# Patient Record
Sex: Female | Born: 1945 | Race: White | Hispanic: No | Marital: Married | Smoking: Never smoker
Health system: Southern US, Community
[De-identification: ages and names within clinical notes are randomized; demographics above are authoritative.]

## PROBLEM LIST (undated history)

## (undated) DIAGNOSIS — J3089 Other allergic rhinitis: Secondary | ICD-10-CM

## (undated) DIAGNOSIS — J45909 Unspecified asthma, uncomplicated: Secondary | ICD-10-CM

## (undated) DIAGNOSIS — Z85828 Personal history of other malignant neoplasm of skin: Secondary | ICD-10-CM

## (undated) DIAGNOSIS — Z780 Asymptomatic menopausal state: Secondary | ICD-10-CM

## (undated) DIAGNOSIS — K219 Gastro-esophageal reflux disease without esophagitis: Secondary | ICD-10-CM

## (undated) DIAGNOSIS — M810 Age-related osteoporosis without current pathological fracture: Secondary | ICD-10-CM

## (undated) DIAGNOSIS — N819 Female genital prolapse, unspecified: Secondary | ICD-10-CM

## (undated) DIAGNOSIS — T7840XA Allergy, unspecified, initial encounter: Secondary | ICD-10-CM

## (undated) DIAGNOSIS — R195 Other fecal abnormalities: Principal | ICD-10-CM

## (undated) DIAGNOSIS — Z8589 Personal history of malignant neoplasm of other organs and systems: Secondary | ICD-10-CM

## (undated) DIAGNOSIS — M8580 Other specified disorders of bone density and structure, unspecified site: Secondary | ICD-10-CM

## (undated) HISTORY — PX: BREAST LUMPECTOMY: SHX2

## (undated) HISTORY — PX: REFRACTIVE SURGERY: SHX103

## (undated) HISTORY — DX: Other specified disorders of bone density and structure, unspecified site: M85.80

## (undated) HISTORY — PX: ELBOW SURGERY: SHX618

## (undated) HISTORY — PX: ABDOMINAL HYSTERECTOMY: SHX81

## (undated) HISTORY — PX: TONSILLECTOMY: SUR1361

## (undated) HISTORY — DX: Personal history of other malignant neoplasm of skin: Z85.828

## (undated) HISTORY — DX: Female genital prolapse, unspecified: N81.9

## (undated) HISTORY — DX: Age-related osteoporosis without current pathological fracture: M81.0

## (undated) HISTORY — DX: Other fecal abnormalities: R19.5

## (undated) HISTORY — DX: Personal history of malignant neoplasm of other organs and systems: Z85.89

## (undated) HISTORY — DX: Allergy, unspecified, initial encounter: T78.40XA

## (undated) HISTORY — PX: CERVICAL CONE BIOPSY: SUR198

## (undated) HISTORY — DX: Asymptomatic menopausal state: Z78.0

---

## 1999-06-03 HISTORY — PX: COLONOSCOPY: SHX174

## 1999-06-03 HISTORY — PX: SIGMOIDOSCOPY: SUR1295

## 2001-09-20 ENCOUNTER — Ambulatory Visit (HOSPITAL_COMMUNITY): Admission: RE | Admit: 2001-09-20 | Discharge: 2001-09-20 | Payer: Self-pay | Admitting: *Deleted

## 2005-04-23 ENCOUNTER — Other Ambulatory Visit: Admission: RE | Admit: 2005-04-23 | Discharge: 2005-04-23 | Payer: Self-pay | Admitting: Family Medicine

## 2010-05-30 ENCOUNTER — Encounter
Admission: RE | Admit: 2010-05-30 | Discharge: 2010-05-30 | Payer: Self-pay | Source: Home / Self Care | Attending: Gastroenterology | Admitting: Gastroenterology

## 2011-06-25 ENCOUNTER — Other Ambulatory Visit: Payer: Self-pay | Admitting: Family Medicine

## 2011-06-25 DIAGNOSIS — R1900 Intra-abdominal and pelvic swelling, mass and lump, unspecified site: Secondary | ICD-10-CM

## 2011-07-01 ENCOUNTER — Ambulatory Visit
Admission: RE | Admit: 2011-07-01 | Discharge: 2011-07-01 | Disposition: A | Discharge status: Home / Self Care | Payer: Medicare Other | Source: Ambulatory Visit | Attending: Family Medicine | Admitting: Family Medicine

## 2011-07-01 DIAGNOSIS — R1900 Intra-abdominal and pelvic swelling, mass and lump, unspecified site: Secondary | ICD-10-CM

## 2011-07-09 ENCOUNTER — Other Ambulatory Visit: Payer: Self-pay

## 2013-09-21 ENCOUNTER — Other Ambulatory Visit: Payer: Self-pay | Admitting: Family Medicine

## 2013-09-21 DIAGNOSIS — R0989 Other specified symptoms and signs involving the circulatory and respiratory systems: Secondary | ICD-10-CM

## 2013-09-22 ENCOUNTER — Ambulatory Visit
Admission: RE | Admit: 2013-09-22 | Discharge: 2013-09-22 | Disposition: A | Discharge status: Home / Self Care | Payer: Medicare Other | Source: Ambulatory Visit | Attending: Family Medicine | Admitting: Family Medicine

## 2013-09-22 DIAGNOSIS — R0989 Other specified symptoms and signs involving the circulatory and respiratory systems: Secondary | ICD-10-CM

## 2013-09-22 HISTORY — DX: Unspecified asthma, uncomplicated: J45.909

## 2013-09-22 HISTORY — DX: Other allergic rhinitis: J30.89

## 2013-09-22 HISTORY — DX: Gastro-esophageal reflux disease without esophagitis: K21.9

## 2013-10-31 ENCOUNTER — Encounter: Payer: Self-pay | Admitting: Podiatry

## 2013-10-31 ENCOUNTER — Ambulatory Visit (INDEPENDENT_AMBULATORY_CARE_PROVIDER_SITE_OTHER): Payer: Medicare Other | Admitting: Podiatry

## 2013-10-31 VITALS — BP 135/77 | HR 73 | Resp 18

## 2013-10-31 DIAGNOSIS — Q828 Other specified congenital malformations of skin: Secondary | ICD-10-CM

## 2013-10-31 NOTE — Progress Notes (Signed)
   Subjective:    Patient ID: Marie Grimes, female    DOB: 1945/06/27, 68 y.o.   MRN: [id number]  HPI I have some places on both my feet that are hurting and on the right foot it is the ball and the left foot it is the ball and the 2nd toe and 5th toe on left The last visit for similar service was 12/06/2012   Review of Systems     Objective:   Physical Exam Orientated x63 space 68 year old white female  Vascular: DP and PT pulses 2/4 bilaterally  Dermatological: Nucleated keratoses sub-third fourth MPJ right and second MPJ left Diffuse plantar keratoses second left toe noted Callus lateral nail groove fifth left toe  Musculoskeletal: Taylor's bunion deformity bilaterally Adductovarus fifth digits bilaterally       Assessment & Plan:   Assessment: Porokeratoses x2 Keratoses x1  Plan: Keratoses are debrided and packed with salinocaine  Reappoint at patient's request

## 2014-12-19 ENCOUNTER — Ambulatory Visit (INDEPENDENT_AMBULATORY_CARE_PROVIDER_SITE_OTHER): Payer: Medicare Other

## 2014-12-19 ENCOUNTER — Ambulatory Visit (INDEPENDENT_AMBULATORY_CARE_PROVIDER_SITE_OTHER): Payer: Medicare Other | Admitting: Podiatry

## 2014-12-19 ENCOUNTER — Encounter: Payer: Self-pay | Admitting: Podiatry

## 2014-12-19 VITALS — BP 117/68 | HR 67 | Resp 15

## 2014-12-19 DIAGNOSIS — M79671 Pain in right foot: Secondary | ICD-10-CM | POA: Diagnosis not present

## 2014-12-19 DIAGNOSIS — M6588 Other synovitis and tenosynovitis, other site: Secondary | ICD-10-CM | POA: Diagnosis not present

## 2014-12-19 DIAGNOSIS — M7750 Other enthesopathy of unspecified foot: Secondary | ICD-10-CM

## 2014-12-19 NOTE — Progress Notes (Signed)
Subjective:     Patient ID: Marie Grimes, female   DOB: 09-04-1945, 69 y.o.   MRN: [id number]  HPIThis patient presents to the office saying she sprained her right foot yesterday.  She says she thought her foot had snapped. She now says there is swelling below her right ankle bone.  She is concerned she may have injured her foot and desires to be evaluated and treated.   Review of Systems     Objective:   Physical Exam GENERAL APPEARANCE: Alert, conversant. Appropriately groomed. No acute distress.  VASCULAR: Pedal pulses palpable at 2/4 DP and PT bilateral.  Capillary refill time is immediate to all digits,  Proximal to distal cooling it warm to warm.  Digital hair growth is present bilateral  NEUROLOGIC: sensation is intact epicritically and protectively to 5.07 monofilament at 5/5 sites bilateral.  Light touch is intact bilateral, vibratory sensation intact bilateral, achilles tendon reflex is intact bilateral.  MUSCULOSKELETAL: acceptable muscle strength, tone and stability bilateral.  Intrinsic muscluature intact bilateral.  Rectus appearance of foot and digits noted bilateral. There is significant swelling along the course of the peroneal tendon right foot.  Weakness noted to peroneal tendon right foot.  DERMATOLOGIC: skin color, texture, and turgor are within normal limits.  No preulcerative lesions are seen, no interdigital maceration noted.  No open lesions present.  Digital nails are asymptomatic. No drainage noted. .     Assessment:     Peroneal tendonitis right foot.     Plan:     IE  Xray anklet  Mobic prescribed.

## 2014-12-20 ENCOUNTER — Telehealth: Payer: Self-pay | Admitting: *Deleted

## 2014-12-20 MED ORDER — MELOXICAM 15 MG PO TABS: 15.0000 mg | ORAL_TABLET | Freq: Every day | ORAL | Status: DC

## 2014-12-20 NOTE — Telephone Encounter (Signed)
Pt states the antiinflammatory that was to be ordered by Dr. Prudence Davidson was not called into [facility] in [place].  Dr. Prudence Davidson ordered Mobic 15mg  #30 1 tablet daily +1 refill.  Order escribed to [facility].

## 2015-01-10 ENCOUNTER — Other Ambulatory Visit: Payer: Self-pay | Admitting: Family Medicine

## 2015-01-10 DIAGNOSIS — R0989 Other specified symptoms and signs involving the circulatory and respiratory systems: Secondary | ICD-10-CM

## 2015-01-17 ENCOUNTER — Ambulatory Visit
Admission: RE | Admit: 2015-01-17 | Discharge: 2015-01-17 | Disposition: A | Discharge status: Home / Self Care | Payer: Medicare Other | Source: Ambulatory Visit | Attending: Family Medicine | Admitting: Family Medicine

## 2015-01-17 DIAGNOSIS — R0989 Other specified symptoms and signs involving the circulatory and respiratory systems: Secondary | ICD-10-CM

## 2015-02-21 ENCOUNTER — Ambulatory Visit: Payer: Medicare Other | Admitting: Podiatry

## 2016-06-02 HISTORY — PX: FRACTURE SURGERY: SHX138

## 2018-01-21 LAB — LIPID PANEL
Cholesterol: 179 (ref 0–200)
HDL: 61 (ref 35–70)
LDL CALC: 108
Triglycerides: 51 (ref 40–160)

## 2018-01-22 LAB — BASIC METABOLIC PANEL
BUN: 14 (ref 4–21)
Creatinine: 0.8 (ref 0.5–1.1)
GLUCOSE: 91
Potassium: 4.4 (ref 3.4–5.3)
Sodium: 140 (ref 137–147)

## 2018-01-22 LAB — HEPATIC FUNCTION PANEL
ALT: 23 (ref 7–35)
AST: 24 (ref 13–35)

## 2018-04-16 ENCOUNTER — Encounter: Payer: Self-pay | Admitting: Osteopathic Medicine

## 2018-04-16 ENCOUNTER — Ambulatory Visit (INDEPENDENT_AMBULATORY_CARE_PROVIDER_SITE_OTHER): Payer: Medicare Other | Admitting: Physician Assistant

## 2018-04-16 ENCOUNTER — Encounter: Payer: Self-pay | Admitting: Physician Assistant

## 2018-04-16 VITALS — BP 144/80 | HR 72 | Wt 179.0 lb

## 2018-04-16 DIAGNOSIS — J4530 Mild persistent asthma, uncomplicated: Secondary | ICD-10-CM | POA: Diagnosis not present

## 2018-04-16 DIAGNOSIS — M8580 Other specified disorders of bone density and structure, unspecified site: Secondary | ICD-10-CM

## 2018-04-16 DIAGNOSIS — N819 Female genital prolapse, unspecified: Secondary | ICD-10-CM | POA: Diagnosis not present

## 2018-04-16 DIAGNOSIS — Z7689 Persons encountering health services in other specified circumstances: Secondary | ICD-10-CM

## 2018-04-16 DIAGNOSIS — Z90710 Acquired absence of both cervix and uterus: Secondary | ICD-10-CM | POA: Diagnosis not present

## 2018-04-16 DIAGNOSIS — Z9109 Other allergy status, other than to drugs and biological substances: Secondary | ICD-10-CM

## 2018-04-16 NOTE — Progress Notes (Signed)
New Patient Office Visit  Subjective:  Patient ID: Marie Grimes, female    DOB: [birth year]  Age: 72 y.o. MRN: [id number]  CC:  Chief Complaint  Patient presents with  . Establish Care    HPI Marie Grimes presents for   She actually intended to establish care with Dr. Sheppard Coil today. She was encouraged to follow-up with either one of [place] (her preference).  She has asthma and environmental allergies. Her symptoms are well-controlled with Advair, Xyzal and Flonase. She reports she does tend to get bronchitis every year and she has a history of pneumonia 8 years ago. She has had mild allergic reactions to both pneumococcal vaccines.  Osteopenia: on Boniva for approx 7 years, she did take a medication holiday for 2-3 years. She has a bone density scheduled for January. She has a history of 2 fractures, most recent was 2018 left distal radius Denies falls in the last year.  Patient Care Team: [name], DO as PCP - General (Osteopathic Medicine) Servando Salina, MD as Consulting Physician (Obstetrics and Gynecology) Rolm Bookbinder, MD as Consulting Physician (Dermatology)   Depression screen [facility] 2/9 04/16/2018  Decreased Interest 0  Down, Depressed, Hopeless 0  PHQ - 2 Score 0   Fall Risk  04/16/2018  Falls in the past year? 0      Past Medical History:  Diagnosis Date  . Acid reflux   . Allergy   . Asthma   . Environmental and seasonal allergies   . History of basal cell carcinoma   . History of squamous cell carcinoma   . Osteopenia after menopause   . Pelvic prolapse     Past Surgical History:  Procedure Laterality Date  . ABDOMINAL HYSTERECTOMY     age 17, endometriosis  . BREAST LUMPECTOMY Left    age 20, benign  . CERVICAL CONE BIOPSY    . ELBOW SURGERY    . FRACTURE SURGERY Left 2018   wrist  . REFRACTIVE SURGERY    . TONSILLECTOMY      Family History  Problem Relation Age of Onset  . Hypertension Mother   . Stroke Mother   .  Stroke Father   . Diabetes Brother   . Celiac disease Brother     Social History   Socioeconomic History  . Marital status: Married    Spouse name: Not on file  . Number of children: Not on file  . Years of education: Not on file  . Highest education level: Not on file  Occupational History  . Not on file  Social Needs  . Financial resource strain: Not on file  . Food insecurity:    Worry: Not on file    Inability: Not on file  . Transportation needs:    Medical: Not on file    Non-medical: Not on file  Tobacco Use  . Smoking status: Never Smoker  . Smokeless tobacco: Never Used  Substance and Sexual Activity  . Alcohol use: Yes    Alcohol/week: 5.0 - 7.0 standard drinks    Types: 5 - 7 Standard drinks or equivalent per week  . Drug use: Never  . Sexual activity: Yes  Lifestyle  . Physical activity:    Days per week: Not on file    Minutes per session: Not on file  . Stress: Not on file  Relationships  . Social connections:    Talks on phone: Not on file    Gets together: Not on file    Attends  religious service: Not on file    Active member of club or organization: Not on file    Attends meetings of clubs or organizations: Not on file    Relationship status: Not on file  . Intimate partner violence:    Fear of current or ex partner: Not on file    Emotionally abused: Not on file    Physically abused: Not on file    Forced sexual activity: Not on file  Other Topics Concern  . Not on file  Social History Narrative  . Not on file    ROS Review of Systems  Respiratory: Positive for wheezing (asthma).   Genitourinary:       + prolapse  Allergic/Immunologic: Positive for environmental allergies.  All other systems reviewed and are negative.   Objective:   Today's Vitals: BP (!) 144/80   Pulse 72   Wt 179 lb (81.2 kg)   Physical Exam Gen:  alert, not ill-appearing, no distress, appropriate for age 72: head normocephalic without obvious  abnormality, conjunctiva and cornea clear, trachea midline Pulm: Normal work of breathing, normal phonation Neuro: alert and oriented x 3, no tremor MSK: extremities atraumatic, normal gait and station Skin: intact, no rashes on exposed skin, no jaundice, no cyanosis Psych: well-groomed, cooperative, good eye contact, euthymic mood, affect mood-congruent, speech is articulate, and thought processes clear and goal-directed  Assessment & Plan:    [name] was seen today for establish care.  Diagnoses and all orders for this visit:  Encounter to establish care  History of hysterectomy for benign disease  Female genital prolapse, unspecified type  Mild persistent asthma without complication  Osteopenia, unspecified location  Environmental allergies   - Personally reviewed PMH, PSH, PFH, medications, allergies, HM - Age-appropriate cancer screening: mammogram UTD per patient, due 06/2018; annual FOBT due 2020 - Other HM: DEXA scheduled 06/2018 - Influenza UTD - Tdap UTD - PHQ2 negative - Negative fall screen - Personally reviewed labs from 12/2017, normal CMP and lipids   Follow-up: Return in about 3 months (around 07/17/2018) for w/Dr A for med mgmt.   [name], [place]

## 2018-05-27 ENCOUNTER — Telehealth: Payer: Self-pay | Admitting: Physician Assistant

## 2018-05-27 NOTE — Telephone Encounter (Signed)
Patient calling in wanting to know if Dr. Sheppard Coil could  be her Primary Care Physician. Please advise.

## 2018-05-27 NOTE — Telephone Encounter (Signed)
Patient was already told by me at her visit that she could see Dr. Sheppard Coil. There was a scheduling error and she was put in an establish care slot with me instead of Dr. Loni Muse

## 2018-05-27 NOTE — Telephone Encounter (Signed)
PCP has been changed for patient and patient has been made aware. No further questions at this time.

## 2018-06-01 ENCOUNTER — Ambulatory Visit (INDEPENDENT_AMBULATORY_CARE_PROVIDER_SITE_OTHER): Payer: Medicare Other

## 2018-06-01 ENCOUNTER — Encounter: Payer: Self-pay | Admitting: Family Medicine

## 2018-06-01 ENCOUNTER — Ambulatory Visit (INDEPENDENT_AMBULATORY_CARE_PROVIDER_SITE_OTHER): Payer: Medicare Other | Admitting: Family Medicine

## 2018-06-01 VITALS — BP 158/80 | HR 69 | Wt 183.0 lb

## 2018-06-01 DIAGNOSIS — M25561 Pain in right knee: Secondary | ICD-10-CM

## 2018-06-01 DIAGNOSIS — M1711 Unilateral primary osteoarthritis, right knee: Secondary | ICD-10-CM

## 2018-06-01 DIAGNOSIS — M5416 Radiculopathy, lumbar region: Secondary | ICD-10-CM

## 2018-06-01 MED ORDER — GABAPENTIN 300 MG PO CAPS: ORAL_CAPSULE | ORAL | 3 refills | Status: DC

## 2018-06-01 MED ORDER — PREDNISONE 50 MG PO TABS: 50.0000 mg | ORAL_TABLET | Freq: Every day | ORAL | 0 refills | Status: DC

## 2018-06-01 NOTE — Patient Instructions (Signed)
Thank you for coming in today. Attend PT.  Take gabapentin up to 3x daily for nerve pain.  Take the short course of prednisone.  Recheck in 4 weeks or sooner if needed.    Sciatica  Sciatica is pain, numbness, weakness, or tingling along the path of the sciatic nerve. The sciatic nerve starts in the lower back and runs down the back of each leg. The nerve controls the muscles in the lower leg and in the back of the knee. It also provides feeling (sensation) to the back of the thigh, the lower leg, and the sole of the foot. Sciatica is a symptom of another medical condition that pinches or puts pressure on the sciatic nerve. Generally, sciatica only affects one side of the body. Sciatica usually goes away on its own or with treatment. In some cases, sciatica may keep coming back (recur). What are the causes? This condition is caused by pressure on the sciatic nerve, or pinching of the sciatic nerve. This may be the result of:  A disk in between the bones of the spine (vertebrae) bulging out too far (herniated disk).  Age-related changes in the spinal disks (degenerative disk disease).  A pain disorder that affects a muscle in the buttock (piriformis syndrome).  Extra bone growth (bone spur) near the sciatic nerve.  An injury or break (fracture) of the pelvis.  Pregnancy.  Tumor (rare). What increases the risk? The following factors may make you more likely to develop this condition:  Playing sports that place pressure or stress on the spine, such as football or weight lifting.  Having poor strength and flexibility.  A history of back injury.  A history of back surgery.  Sitting for long periods of time.  Doing activities that involve repetitive bending or lifting.  Obesity. What are the signs or symptoms? Symptoms can vary from mild to very severe, and they may include:  Any of these problems in the lower back, leg, hip, or buttock: ? Mild tingling or dull  aches. ? Burning sensations. ? Sharp pains.  Numbness in the back of the calf or the sole of the foot.  Leg weakness.  Severe back pain that makes movement difficult. These symptoms may get worse when you cough, sneeze, or laugh, or when you sit or stand for long periods of time. Being overweight may also make symptoms worse. In some cases, symptoms may recur over time. How is this diagnosed? This condition may be diagnosed based on:  Your symptoms.  A physical exam. Your health care provider may ask you to do certain movements to check whether those movements trigger your symptoms.  You may have tests, including: ? Blood tests. ? X-rays. ? MRI. ? CT scan. How is this treated? In many cases, this condition improves on its own, without any treatment. However, treatment may include:  Reducing or modifying physical activity during periods of pain.  Exercising and stretching to strengthen your abdomen and improve the flexibility of your spine.  Icing and applying heat to the affected area.  Medicines that help: ? To relieve pain and swelling. ? To relax your muscles.  Injections of medicines that help to relieve pain, irritation, and inflammation around the sciatic nerve (steroids).  Surgery. Follow these instructions at home: Medicines  Take over-the-counter and prescription medicines only as told by your health care provider.  Do not drive or operate heavy machinery while taking prescription pain medicine. Managing pain  If directed, apply ice to the affected area. ?  Put ice in a plastic bag. ? Place a towel between your skin and the bag. ? Leave the ice on for 20 minutes, 2-3 times a day.  After icing, apply heat to the affected area before you exercise or as often as told by your health care provider. Use the heat source that your health care provider recommends, such as a moist heat pack or a heating pad. ? Place a towel between your skin and the heat  source. ? Leave the heat on for 20-30 minutes. ? Remove the heat if your skin turns bright red. This is especially important if you are unable to feel pain, heat, or cold. You may have a greater risk of getting burned. Activity  Return to your normal activities as told by your health care provider. Ask your health care provider what activities are safe for you. ? Avoid activities that make your symptoms worse.  Take brief periods of rest throughout the day. Resting in a lying or standing position is usually better than sitting to rest. ? When you rest for longer periods, mix in some mild activity or stretching between periods of rest. This will help to prevent stiffness and pain. ? Avoid sitting for long periods of time without moving. Get up and move around at least one time each hour.  Exercise and stretch regularly, as told by your health care provider.  Do not lift anything that is heavier than 10 lb (4.5 kg) while you have symptoms of sciatica. When you do not have symptoms, you should still avoid heavy lifting, especially repetitive heavy lifting.  When you lift objects, always use proper lifting technique, which includes: ? Bending your knees. ? Keeping the load close to your body. ? Avoiding twisting. General instructions  Use good posture. ? Avoid leaning forward while sitting. ? Avoid hunching over while standing.  Maintain a healthy weight. Excess weight puts extra stress on your back and makes it difficult to maintain good posture.  Wear supportive, comfortable shoes. Avoid wearing high heels.  Avoid sleeping on a mattress that is too soft or too hard. A mattress that is firm enough to support your back when you sleep may help to reduce your pain.  Keep all follow-up visits as told by your health care provider. This is important. Contact a health care provider if:  You have pain that wakes you up when you are sleeping.  You have pain that gets worse when you lie  down.  Your pain is worse than you have experienced in the past.  Your pain lasts longer than 4 weeks.  You experience unexplained weight loss. Get help right away if:  You lose control of your bowel or bladder (incontinence).  You have: ? Weakness in your lower back, pelvis, buttocks, or legs that gets worse. ? Redness or swelling of your back. ? A burning sensation when you urinate. This information is not intended to replace advice given to you by your health care provider. Make sure you discuss any questions you have with your health care provider. Document Released: 05/13/2001 Document Revised: 10/23/2015 Document Reviewed: 01/26/2015 Elsevier Interactive Patient Education  2019 [employer].

## 2018-06-01 NOTE — Progress Notes (Signed)
Marie Grimes is a 72 y.o. female who presents to [facility] today for right leg pain.  Marie Grimes has a several week history of right lower leg and right lateral knee pain.  This occurred when she suffered a twisting injury to her knee.  She denies any popping or swelling.  She points to the proximal calf laterally as the area of maximum pain.  Pain is worse with prolonged standing and ambulation and better with rest.  She does also have some pain at night.  She denies any weakness or numbness.  She does also note pain in her back and her right low back as well.  She denies any new bowel or bladder problems.  No fevers or chills.    ROS:  As above  Exam:  BP (!) 158/80   Pulse 69   Wt 183 lb (83 kg)  General: Well Developed, well nourished, and in no acute distress.  Neuro/Psych: Alert and oriented x3, extra-ocular muscles intact, able to move all 4 extremities, sensation grossly intact. Skin: Warm and dry, no rashes noted.  Respiratory: Not using accessory muscles, speaking in full sentences, trachea midline.  Cardiovascular: Pulses palpable, no extremity edema. Abdomen: Does not appear distended. MSK:  L-spine nontender to spinal midline.  Tender palpation lumbar paraspinal musculature in the right. Lumbar motion is intact. Mildly positive right-sided slump test. Reflexes strength and sensation are equal bilateral lower extremities.  Right knee normal-appearing nontender normal motion stable ligamentous exam negative McMurray's testing.  Left knee nontender normal motion stable ligaments exam testing.  Right lower leg normal-appearing nontender no swelling or palpable cords.     Lab and Radiology Results X-ray images right knee personally independently reviewed.  Mild degenerative changes present without acute fracture.  Relatively normal knee x-ray Await formal radiology review    Assessment and Plan: 72 y.o. female with  right leg pain.  Etiology is somewhat unclear.  Patient describes a twisting injury however her knee exam is largely benign as is her x-ray.  Her pain is more consistent with the distribution of right L5 radiculopathy.  She does have some back pain to support this.  Additionally she does have a positive slump test.  Plan to proceed with a short course of prednisone, physical therapy, and gabapentin.  Recheck in about 4 weeks or sooner if needed.  If not improving neck step would be x-ray L-spine and potential MRI for epidural steroid injection planning.  Lengthy discussion with patient regarding the uncertainty of her diagnosis treatment plan and options and neck steps.    Orders Placed This Encounter  Procedures  . DG Knee Complete 4 Views Right    Please include patellar sunrise, lateral, and weightbearing bilateral AP and bilateral rosenberg views    Standing Status:   Future    Number of Occurrences:   1    Standing Expiration Date:   08/01/2019    Order Specific Question:   Reason for exam:    Answer:   Please include patellar sunrise, lateral, and weightbearing bilateral AP and bilateral rosenberg views    Comments:   Please include patellar sunrise, lateral, and weightbearing bilateral AP and bilateral rosenberg views    Order Specific Question:   Preferred imaging location?    Answer:   [facility]  . DG Knee 1-2 Views Left    Standing Status:   Future    Number of Occurrences:   1    Standing Expiration Date:  08/02/2019    Order Specific Question:   Reason for Exam (SYMPTOM  OR DIAGNOSIS REQUIRED)    Answer:   For use with right knee x-ray, bilateral AP and Rosenberg standing.    Order Specific Question:   Preferred imaging location?    Answer:   [facility]  . Ambulatory referral to Physical Therapy    Referral Priority:   Routine    Referral Type:   Physical Medicine    Referral Reason:   Specialty Services Required    Requested Specialty:   Physical  Therapy   Meds ordered this encounter  Medications  . predniSONE (DELTASONE) 50 MG tablet    Sig: Take 1 tablet (50 mg total) by mouth daily.    Dispense:  5 tablet    Refill:  0  . gabapentin (NEURONTIN) 300 MG capsule    Sig: One tab PO qHS for a week, then BID for a week, then TID. May double weekly to a max of 3,600mg /day    Dispense:  180 capsule    Refill:  3    Historical information moved to improve visibility of documentation.  Past Medical History:  Diagnosis Date  . Acid reflux   . Allergy   . Asthma   . Environmental and seasonal allergies   . History of basal cell carcinoma   . History of squamous cell carcinoma   . Osteopenia after menopause   . Pelvic prolapse    Past Surgical History:  Procedure Laterality Date  . ABDOMINAL HYSTERECTOMY     age 70, endometriosis  . BREAST LUMPECTOMY Left    age 75, benign  . CERVICAL CONE BIOPSY    . ELBOW SURGERY    . FRACTURE SURGERY Left 2018   wrist  . REFRACTIVE SURGERY    . TONSILLECTOMY     Social History   Tobacco Use  . Smoking status: Never Smoker  . Smokeless tobacco: Never Used  Substance Use Topics  . Alcohol use: Yes    Alcohol/week: 5.0 - 7.0 standard drinks    Types: 5 - 7 Standard drinks or equivalent per week   family history includes Celiac disease in her brother; Diabetes in her brother; Hypertension in her mother; Stroke in her father and mother.  Medications: Current Outpatient Medications  Medication Sig Dispense Refill  . ADVAIR DISKUS 100-50 MCG/DOSE AEPB     . albuterol (PROVENTIL HFA;VENTOLIN HFA) 108 (90 BASE) MCG/ACT inhaler Inhale into the lungs.    . Calcium Carb-Cholecalciferol (CALCIUM PLUS D3 ABSORBABLE PO) Take by mouth.    . fluticasone (FLONASE) 50 MCG/ACT nasal spray     . Glucosamine HCl (GLUCOSAMINE PO) Take by mouth.    . ibandronate (BONIVA) 150 MG tablet TAKE 1 TABLET BY MOUTH MONTHLY  3  . levocetirizine (XYZAL) 5 MG tablet     . Multiple Vitamins-Minerals (WOMENS  MULTIVITAMIN PO) Take by mouth.    . Nutritional Supplements (ESTROVEN PO) Take by mouth.    Marland Kitchen omeprazole (PRILOSEC) 40 MG capsule     . XIIDRA 5 % SOLN Place 1 drop into both eyes 2 (two) times daily.  10  . gabapentin (NEURONTIN) 300 MG capsule One tab PO qHS for a week, then BID for a week, then TID. May double weekly to a max of 3,600mg /day 180 capsule 3  . predniSONE (DELTASONE) 50 MG tablet Take 1 tablet (50 mg total) by mouth daily. 5 tablet 0   No current facility-administered medications for this visit.  Allergies  Allergen Reactions  . Pneumococcal Polysaccharide Vaccine Other (See Comments)    muscle spasms and cramping  . Pertussis Vaccines Other (See Comments)    Fever, injection site reaction      Discussed warning signs or symptoms. Please see discharge instructions. Patient expresses understanding.

## 2018-06-08 ENCOUNTER — Other Ambulatory Visit: Payer: Self-pay

## 2018-06-08 ENCOUNTER — Encounter: Payer: Self-pay | Admitting: Physical Therapy

## 2018-06-08 ENCOUNTER — Ambulatory Visit: Payer: Medicare Other | Admitting: Physical Therapy

## 2018-06-08 DIAGNOSIS — R2689 Other abnormalities of gait and mobility: Secondary | ICD-10-CM

## 2018-06-08 DIAGNOSIS — R252 Cramp and spasm: Secondary | ICD-10-CM

## 2018-06-08 DIAGNOSIS — M5441 Lumbago with sciatica, right side: Secondary | ICD-10-CM

## 2018-06-08 DIAGNOSIS — M25561 Pain in right knee: Secondary | ICD-10-CM | POA: Diagnosis not present

## 2018-06-08 LAB — HM DEXA SCAN: HM Dexa Scan: NORMAL

## 2018-06-08 LAB — HM MAMMOGRAPHY

## 2018-06-08 NOTE — Therapy (Signed)
[place], [place], [address] Phone: [phone]   Fax:  [phone]  Physical Therapy Evaluation  Patient Details  Name: Marie Grimes MRN: [id number] Date of Birth: 11-17-1945 Referring Provider (PT): Gregor Hams, MD   Encounter Date: 06/08/2018  PT End of Session - 06/08/18 1112    Visit Number  1    Number of Visits  12    Date for PT Re-Evaluation  07/20/18    PT Start Time  1034    PT Stop Time  1112    PT Time Calculation (min)  38 min    Activity Tolerance  Patient tolerated treatment well    Behavior During Therapy  [facility] for tasks assessed/performed       Past Medical History:  Diagnosis Date  . Acid reflux   . Allergy   . Asthma   . Environmental and seasonal allergies   . History of basal cell carcinoma   . History of squamous cell carcinoma   . Osteopenia after menopause   . Pelvic prolapse     Past Surgical History:  Procedure Laterality Date  . ABDOMINAL HYSTERECTOMY     age 48, endometriosis  . BREAST LUMPECTOMY Left    age 38, benign  . CERVICAL CONE BIOPSY    . ELBOW SURGERY    . FRACTURE SURGERY Left 2018   wrist  . REFRACTIVE SURGERY    . TONSILLECTOMY      There were no vitals filed for this visit.   Subjective Assessment - 06/08/18 1035    Subjective  Pt is a 73 y/o female who presents to OPPT for chronic Rt sided LBP with pain radiating into Rt knee and foot.  Pt reports pain into RLE x 1 month with unknown cause.  Pain improved with medication from MD.    Diagnostic tests  xrays Rt knee: negative    Patient Stated Goals  exercises, improve pain    Currently in Pain?  Yes    Pain Score  1     Pain Location  Heel   and foot; occasional LBP   Pain Orientation  Right    Pain Descriptors / Indicators  Sore;Aching;Shooting    Pain Type  Acute pain    Pain Radiating Towards  RLE to foot    Pain Onset  1 to 4 weeks ago    Pain Frequency  Intermittent     Aggravating Factors   unknown    Pain Relieving Factors  prednisone, gabapentin, stretching, using massage bed         [facility] PT Assessment - 06/08/18 1040      Assessment   Medical Diagnosis  M54.16 (ICD-10-CM) - Lumbar radiculopathy; M25.561 (ICD-10-CM) - Acute pain of right knee     Referring Provider (PT)  Gregor Hams, MD    Onset Date/Surgical Date  --   1 month   Hand Dominance  Right    Next MD Visit  07/02/18    Prior Therapy  unrelated to this condition      Precautions   Precautions  None      Restrictions   Weight Bearing Restrictions  No      Balance Screen   Has the patient fallen in the past 6 months  No    Has the patient had a decrease in activity level because of a fear of falling?   No    Is the patient reluctant to  leave their home because of a fear of falling?   No      Home Environment   Living Environment  Private residence   independent living   Living Arrangements  Spouse/significant other    Type of [place] Access  Level entry    [place]  One level      Prior Function   Level of Independence  Independent    Vocation  Retired    Biomedical scientist  retired higher education administration    Leisure  play with 2 dogs, travel, gardening, reading, walks dogs 3x/wk      Cognition   Overall Cognitive Status  Within Functional Limits for tasks assessed      Observation/Other Assessments   Focus on Therapeutic Outcomes (FOTO)   incorrect set up      Posture/Postural Control   Posture/Postural Control  Postural limitations    Postural Limitations  Rounded Shoulders;Forward head      ROM / Strength   AROM / PROM / Strength  AROM;Strength      AROM   AROM Assessment Site  Lumbar    Lumbar Flexion  WNL    Lumbar Extension  WNL    Lumbar - Right Side Bend  WNL    Lumbar - Left Side Bend  WNL    Lumbar - Right Rotation  WNL    Lumbar - Left Rotation  WNL      Strength   Strength Assessment Site  Hip;Knee;Ankle     Right/Left Hip  Right;Left    Right Hip Flexion  4/5    Right Hip Extension  3/5    Right Hip ABduction  3/5    Left Hip Flexion  4/5    Left Hip Extension  4/5    Left Hip ABduction  3+/5    Right/Left Knee  Right;Left    Right Knee Flexion  4/5    Right Knee Extension  5/5    Left Knee Flexion  4/5    Left Knee Extension  5/5    Right/Left Ankle  Right;Left    Right Ankle Dorsiflexion  5/5    Left Ankle Dorsiflexion  5/5      Flexibility   Soft Tissue Assessment /Muscle Length  yes    Hamstrings  WNL (cramping with testing in high hamstrings)    Piriformis  tightness Rt >Lt      Palpation   SI assessment   Rt PSIS elevated    Palpation comment  trigger points noted in Rt glute med/min and pirfiormis      Special Tests    Special Tests  Lumbar    Lumbar Tests  Straight Leg Raise      Straight Leg Raise   Findings  Negative      Ambulation/Gait   Gait Pattern  Decreased stance time - right;Decreased step length - left;Antalgic                Objective measurements completed on examination: See above findings.      [place] Adult PT Treatment/Exercise - 06/08/18 1040      Exercises   Exercises  Lumbar      Lumbar Exercises: Stretches   Passive Hamstring Stretch  Right;1 rep;30 seconds    Passive Hamstring Stretch Limitations  bent knee    Piriformis Stretch  Right;1 rep;30 seconds      Lumbar Exercises: Supine   Other Supine Lumbar Exercises  isometric Rt hip  extension 10 x 5 sec with decrease in symptoms             PT Education - 06/08/18 1112    Education Details  HEP    Person(s) Educated  Patient    Methods  Explanation;Demonstration;Handout    Comprehension  Verbalized understanding;Returned demonstration;Need further instruction          PT Long Term Goals - 06/08/18 1350      PT LONG TERM GOAL #1   Title  independent with HEP    Status  New    Target Date  07/20/18      PT LONG TERM GOAL #2   Title  improve Rt hip strength  to at least 4+/5 for improved function     Status  New    Target Date  07/20/18      PT LONG TERM GOAL #3   Title  report pain < 2/10 with activity x 1 week for improved function    Status  New    Target Date  07/20/18      PT LONG TERM GOAL #4   Title  ambulate with decreased deviations for improved functional mobility    Status  New    Target Date  07/20/18             Plan - 06/08/18 1112    Clinical Impression Statement  Pt is a 73 y/o female who presents to OPPT for RLE pain consistent with lumbar radiculopathy.  Pt with pain mostly in the Rt knee and foot/heel but has pain that initiates in Rt SIJ and laterally.  Trigger points noted in glutes and piriformis.  Pt demonstrates decreased strength, decreased flexibility and elevated Rt PSIS.  Pt with decreased pain following muscle energy technique and provided as HEP.  Pt will benefit from PT to address deficits listed.    History and Personal Factors relevant to plan of care:  osteopenia, asthma, hx skin cancer    Clinical Presentation  Evolving    Clinical Presentation due to:  Rt knee pain without injury, likely radicular    Clinical Decision Making  Moderate    Rehab Potential  Good    PT Frequency  2x / week    PT Duration  6 weeks    PT Treatment/Interventions  ADLs/Self Care Home Management;Electrical Stimulation;Cryotherapy;Iontophoresis 4mg /ml Dexamethasone;Moist Heat;Traction;Ultrasound;Gait training;Stair training;Functional mobility training;Therapeutic activities;Neuromuscular re-education;Balance training;Therapeutic exercise;Patient/family education;Manual techniques;Dry needling;Taping    PT Next Visit Plan  review HEP, reassess SIJ and treat PRN, continue with hip/core strengthening, manual/modalities/DN PRN    PT Home Exercise Plan  Access Code: [email or web address]     Consulted and Agree with Plan of Care  Patient       Patient will benefit from skilled therapeutic intervention in order to improve the following  deficits and impairments:  Abnormal gait, Increased fascial restricitons, Increased muscle spasms, Decreased strength, Impaired flexibility, Pain, Postural dysfunction  Visit Diagnosis: Acute right-sided low back pain with right-sided sciatica - Plan: PT plan of care cert/re-cert  Acute pain of right knee - Plan: PT plan of care cert/re-cert  Other abnormalities of gait and mobility - Plan: PT plan of care cert/re-cert  Cramp and spasm - Plan: PT plan of care cert/re-cert     Problem List Patient Active Problem List   Diagnosis Date Noted  . Osteopenia 04/16/2018  . History of hysterectomy for benign disease 04/16/2018  . Mild persistent asthma without complication 81/19/1478  . Environmental allergies 04/16/2018  . Pelvic  prolapse       Laureen Abrahams, PT, DPT 06/08/18 1:55 PM     [facility] [place], [place], [address] Phone: [phone]   Fax:  [phone]  Name: Marie Grimes MRN: [id number] Date of Birth: May 06, 1946

## 2018-06-08 NOTE — Patient Instructions (Signed)
Access Code: [email or web address]  URL: [email or web address]  Date: 06/08/2018  Prepared by: Faustino Congress   Exercises  90/90 SI Joint Self-Correction - 10 reps - 1 sets - 5 second hold - 2x daily - 7x weekly  Supine Piriformis Stretch - 3 reps - 1 sets - 30 sec hold - 2x daily - 7x weekly  Supine Hamstring Stretch - 3 reps - 1 sets - 30 sec hold - 2x daily - 7x weekly

## 2018-06-11 ENCOUNTER — Ambulatory Visit: Payer: Medicare Other | Admitting: Physical Therapy

## 2018-06-11 DIAGNOSIS — M5441 Lumbago with sciatica, right side: Secondary | ICD-10-CM

## 2018-06-11 DIAGNOSIS — M25561 Pain in right knee: Secondary | ICD-10-CM

## 2018-06-11 DIAGNOSIS — R2689 Other abnormalities of gait and mobility: Secondary | ICD-10-CM

## 2018-06-11 NOTE — Patient Instructions (Signed)
Access Code: [email or web address]  URL: [email or web address]  Date: 06/11/2018  Prepared by: Kerin Perna   Exercises  Hooklying Hamstring Stretch with Strap - 3 reps - 1 sets - 30 seconds hold - 2x daily - 7x weekly  Supine Piriformis Stretch with Foot on Ground - 3 reps - 1 sets - 30 seconds hold - 2x daily - 7x weekly  Prone Quadriceps Stretch with Strap - 10 reps - 3 sets - 1x daily - 7x weekly  Supine Bridge - 10 reps - 1-2 sets - 5 seconds hold - 1x daily - 7x weekly  Seated Transversus Abdominis Bracing - 10 reps - 1 sets - 5-10 hold - 1x daily - 7x weekly

## 2018-06-11 NOTE — Therapy (Signed)
[place], [place], [address] Phone: [phone]   Fax:  [phone]  Physical Therapy Treatment  Patient Details  Name: Marie Grimes MRN: [id number] Date of Birth: Mar 05, 1946 Referring Provider (PT): Gregor Hams, MD   Encounter Date: 06/11/2018  PT End of Session - 06/11/18 1022    Visit Number  2    Number of Visits  12    Date for PT Re-Evaluation  07/20/18    PT Start Time  1020    PT Stop Time  1058    PT Time Calculation (min)  38 min    Activity Tolerance  Patient tolerated treatment well    Behavior During Therapy  [facility] for tasks assessed/performed       Past Medical History:  Diagnosis Date  . Acid reflux   . Allergy   . Asthma   . Environmental and seasonal allergies   . History of basal cell carcinoma   . History of squamous cell carcinoma   . Osteopenia after menopause   . Pelvic prolapse     Past Surgical History:  Procedure Laterality Date  . ABDOMINAL HYSTERECTOMY     age 33, endometriosis  . BREAST LUMPECTOMY Left    age 43, benign  . CERVICAL CONE BIOPSY    . ELBOW SURGERY    . FRACTURE SURGERY Left 2018   wrist  . REFRACTIVE SURGERY    . TONSILLECTOMY      There were no vitals filed for this visit.  Subjective Assessment - 06/11/18 1023    Subjective  Pt reports she has a sensation of tenderness to touch in her Rt heel, knee and buttock, but that it's not sore.  she is taking Gabapentin 1x/day.  She can tell 2 of the stretches are "really working".  She is feeling better than the last visit, which she attributes to sleeping better at not.     Patient Stated Goals  exercises, improve pain    Currently in Pain?  No/denies    Pain Score  0-No pain         [facility] PT Assessment - 06/11/18 0001      Assessment   Medical Diagnosis  M54.16 (ICD-10-CM) - Lumbar radiculopathy; M25.561 (ICD-10-CM) - Acute pain of right knee     Referring Provider (PT)  Gregor Hams, MD    Onset Date/Surgical Date  --   1 month   Hand Dominance  Right    Next MD Visit  07/02/18    Prior Therapy  unrelated to this condition      Flexibility   Soft Tissue Assessment /Muscle Length  yes    Quadriceps bilat 95  deg      Palpation   SI assessment   Lt ASIS higher than Rt in supine        [facility] Adult PT Treatment/Exercise - 06/11/18 0001      Lumbar Exercises: Stretches   Passive Hamstring Stretch  Right;Left;2 reps;30 seconds   supine with strap; opp knee bent   Quad Stretch  Right;Left;2 reps;30 seconds   prone with strap   Piriformis Stretch  Right;Left;2 reps;30 seconds   opp knee bent     Lumbar Exercises: Aerobic   Nustep  L4: arms/legs x 5 min  for warm up   PTA present to discuss progress     Lumbar Exercises: Seated   Other Seated Lumbar Exercises  Pt educated on sit to/from supine  via cues and demo; pt returned demo.     Other Seated Lumbar Exercises  ab set with sit to stand x 2 reps      Lumbar Exercises: Supine   Ab Set  10 reps;5 seconds    AB Set Limitations  tactile cues and VC for proper initiation.  performed supine lap press with TA bracing x 5 reps    Bridge  10 reps;5 seconds   cues for breath                 PT Long Term Goals - 06/08/18 1350      PT LONG TERM GOAL #1   Title  independent with HEP    Status  New    Target Date  07/20/18      PT LONG TERM GOAL #2   Title  improve Rt hip strength to at least 4+/5 for improved function     Status  New    Target Date  07/20/18      PT LONG TERM GOAL #3   Title  report pain < 2/10 with activity x 1 week for improved function    Status  New    Target Date  07/20/18      PT LONG TERM GOAL #4   Title  ambulate with decreased deviations for improved functional mobility    Status  New    Target Date  07/20/18            Plan - 06/11/18 1101    Clinical Impression Statement  Some discomfort in low back reported after prone Lt quad stretch; pt encouraged  to modify stretch as to not elicit pain in LB.  Otherwise, all other exercises tolerated well without symptoms.  Pt presented with slight asymmetries of pelvis; will assess and treat next visit if symptomatic.  Pt will benefit from continued PT intervention to maximize functional mobility.     Rehab Potential  Good    PT Frequency  2x / week    PT Duration  6 weeks    PT Treatment/Interventions  ADLs/Self Care Home Management;Electrical Stimulation;Cryotherapy;Iontophoresis 4mg /ml Dexamethasone;Moist Heat;Traction;Ultrasound;Gait training;Stair training;Functional mobility training;Therapeutic activities;Neuromuscular re-education;Balance training;Therapeutic exercise;Patient/family education;Manual techniques;Dry needling;Taping    PT Next Visit Plan  review HEP, reassess SIJ and treat PRN, continue with hip/core strengthening, manual/modalities/DN PRN    PT Home Exercise Plan  Access Code: [email or web address] - updated.     Consulted and Agree with Plan of Care  Patient       Patient will benefit from skilled therapeutic intervention in order to improve the following deficits and impairments:  Abnormal gait, Increased fascial restricitons, Increased muscle spasms, Decreased strength, Impaired flexibility, Pain, Postural dysfunction  Visit Diagnosis: Acute right-sided low back pain with right-sided sciatica  Acute pain of right knee  Other abnormalities of gait and mobility     Problem List Patient Active Problem List   Diagnosis Date Noted  . Osteopenia 04/16/2018  . History of hysterectomy for benign disease 04/16/2018  . Mild persistent asthma without complication 12/87/8676  . Environmental allergies 04/16/2018  . Pelvic prolapse    Kerin Perna, PTA 06/11/18 1:17 PM  [facility] [place], [place], [address] Phone: [phone]   Fax:  [phone]  Name: Marie Grimes MRN: [id number] Date of Birth:  1945-11-19

## 2018-06-15 ENCOUNTER — Encounter: Payer: Self-pay | Admitting: Physical Therapy

## 2018-06-15 ENCOUNTER — Ambulatory Visit: Payer: Medicare Other | Admitting: Physical Therapy

## 2018-06-15 DIAGNOSIS — M25561 Pain in right knee: Secondary | ICD-10-CM

## 2018-06-15 DIAGNOSIS — M5441 Lumbago with sciatica, right side: Secondary | ICD-10-CM

## 2018-06-15 DIAGNOSIS — R252 Cramp and spasm: Secondary | ICD-10-CM

## 2018-06-15 DIAGNOSIS — R2689 Other abnormalities of gait and mobility: Secondary | ICD-10-CM

## 2018-06-15 NOTE — Therapy (Signed)
[place], [place], [address] Phone: [phone]   Fax:  [phone]  Physical Therapy Treatment  Patient Details  Name: Marie Grimes MRN: [id number] Date of Birth: 11-04-1945 Referring Provider (PT): Gregor Hams, MD   Encounter Date: 06/15/2018  PT End of Session - 06/15/18 1127    Visit Number  3    Number of Visits  12    Date for PT Re-Evaluation  07/20/18    PT Start Time  1034    PT Stop Time  1115    PT Time Calculation (min)  41 min    Activity Tolerance  Patient tolerated treatment well    Behavior During Therapy  [facility] for tasks assessed/performed       Past Medical History:  Diagnosis Date  . Acid reflux   . Allergy   . Asthma   . Environmental and seasonal allergies   . History of basal cell carcinoma   . History of squamous cell carcinoma   . Osteopenia after menopause   . Pelvic prolapse     Past Surgical History:  Procedure Laterality Date  . ABDOMINAL HYSTERECTOMY     age 8, endometriosis  . BREAST LUMPECTOMY Left    age 8, benign  . CERVICAL CONE BIOPSY    . ELBOW SURGERY    . FRACTURE SURGERY Left 2018   wrist  . REFRACTIVE SURGERY    . TONSILLECTOMY      There were no vitals filed for this visit.  Subjective Assessment - 06/15/18 1036    Subjective  feelin much better; no longer taking any medication for pain    Patient Stated Goals  exercises, improve pain    Currently in Pain?  Yes    Pain Score  --   "0.5/10"   Pain Location  Hip    Pain Orientation  Right    Pain Descriptors / Indicators  Aching    Pain Type  Acute pain    Pain Onset  1 to 4 weeks ago    Pain Frequency  Intermittent         [facility] PT Assessment - 06/15/18 1119      Assessment   Medical Diagnosis  M54.16 (ICD-10-CM) - Lumbar radiculopathy; M25.561 (ICD-10-CM) - Acute pain of right knee     Referring Provider (PT)  Gregor Hams, MD    Next MD Visit  07/02/18      Palpation   SI assessment   SI assessment level                   [facility] Adult PT Treatment/Exercise - 06/15/18 1037      Lumbar Exercises: Stretches   Passive Hamstring Stretch  Right;Left;2 reps;30 seconds   supine with strap; opp knee bent   Piriformis Stretch  Right;Left;2 reps;30 seconds   opp knee bent     Lumbar Exercises: Aerobic   Nustep  L5: arms/legs x 6 min  for warm up      Lumbar Exercises: Standing   Other Standing Lumbar Exercises  hip extension and abduction x10 reps bil with cues for abdominal engagement      Lumbar Exercises: Supine   Ab Set  10 reps;5 seconds    Clam  10 reps    Clam Limitations  alternating    Bridge  10 reps;5 seconds  PT Long Term Goals - 06/08/18 1350      PT LONG TERM GOAL #1   Title  independent with HEP    Status  New    Target Date  07/20/18      PT LONG TERM GOAL #2   Title  improve Rt hip strength to at least 4+/5 for improved function     Status  New    Target Date  07/20/18      PT LONG TERM GOAL #3   Title  report pain < 2/10 with activity x 1 week for improved function    Status  New    Target Date  07/20/18      PT LONG TERM GOAL #4   Title  ambulate with decreased deviations for improved functional mobility    Status  New    Target Date  07/20/18            Plan - 06/15/18 1129    Clinical Impression Statement  Pt tolerated session well and reported improved pain following session, which pain level was already minimal today.  Pt with centralization of symptoms at this time with occasional episodes of radicular symptoms.  Overall progressing well with PT and progressing well towards goals.  May be ready for d/c early pending progress.    Rehab Potential  Good    PT Frequency  2x / week    PT Duration  6 weeks    PT Treatment/Interventions  ADLs/Self Care Home Management;Electrical Stimulation;Cryotherapy;Iontophoresis 4mg /ml Dexamethasone;Moist  Heat;Traction;Ultrasound;Gait training;Stair training;Functional mobility training;Therapeutic activities;Neuromuscular re-education;Balance training;Therapeutic exercise;Patient/family education;Manual techniques;Dry needling;Taping    PT Next Visit Plan  reassess SIJ and treat PRN, continue with hip/core strengthening, manual/modalities/DN PRN    PT Home Exercise Plan  Access Code: [email or web address]    Consulted and Agree with Plan of Care  Patient       Patient will benefit from skilled therapeutic intervention in order to improve the following deficits and impairments:  Abnormal gait, Increased fascial restricitons, Increased muscle spasms, Decreased strength, Impaired flexibility, Pain, Postural dysfunction  Visit Diagnosis: Acute right-sided low back pain with right-sided sciatica  Acute pain of right knee  Other abnormalities of gait and mobility  Cramp and spasm     Problem List Patient Active Problem List   Diagnosis Date Noted  . Osteopenia 04/16/2018  . History of hysterectomy for benign disease 04/16/2018  . Mild persistent asthma without complication 32/44/0102  . Environmental allergies 04/16/2018  . Pelvic prolapse       Laureen Abrahams, PT, DPT 06/15/18 11:31 AM    [facility] [place], [place], [address] Phone: [phone]   Fax:  [phone]  Name: SUMEDHA MUNNERLYN MRN: [id number] Date of Birth: [birth year]

## 2018-06-18 ENCOUNTER — Ambulatory Visit: Payer: Medicare Other | Admitting: Physical Therapy

## 2018-06-18 ENCOUNTER — Encounter: Payer: Self-pay | Admitting: Physical Therapy

## 2018-06-18 DIAGNOSIS — M25561 Pain in right knee: Secondary | ICD-10-CM

## 2018-06-18 DIAGNOSIS — M5441 Lumbago with sciatica, right side: Secondary | ICD-10-CM | POA: Diagnosis not present

## 2018-06-18 DIAGNOSIS — R2689 Other abnormalities of gait and mobility: Secondary | ICD-10-CM | POA: Diagnosis not present

## 2018-06-18 DIAGNOSIS — R252 Cramp and spasm: Secondary | ICD-10-CM | POA: Diagnosis not present

## 2018-06-18 NOTE — Therapy (Signed)
[place], [place], [address] Phone: [phone]   Fax:  [phone]  Physical Therapy Treatment  Patient Details  Name: Marie Grimes MRN: [id number] Date of Birth: 11/11/1945 Referring Provider (PT): Gregor Hams, MD   Encounter Date: 06/18/2018  PT End of Session - 06/18/18 1146    Visit Number  4    Number of Visits  12    Date for PT Re-Evaluation  07/20/18    PT Start Time  1030    PT Stop Time  1110    PT Time Calculation (min)  40 min    Activity Tolerance  Patient tolerated treatment well    Behavior During Therapy  [facility] for tasks assessed/performed       Past Medical History:  Diagnosis Date  . Acid reflux   . Allergy   . Asthma   . Environmental and seasonal allergies   . History of basal cell carcinoma   . History of squamous cell carcinoma   . Osteopenia after menopause   . Pelvic prolapse     Past Surgical History:  Procedure Laterality Date  . ABDOMINAL HYSTERECTOMY     age 89, endometriosis  . BREAST LUMPECTOMY Left    age 59, benign  . CERVICAL CONE BIOPSY    . ELBOW SURGERY    . FRACTURE SURGERY Left 2018   wrist  . REFRACTIVE SURGERY    . TONSILLECTOMY      There were no vitals filed for this visit.  Subjective Assessment - 06/18/18 1033    Subjective  hamstring is cramping with bridging but otherwise doing well.  feels the piriformis is looser.    Patient Stated Goals  exercises, improve pain    Pain Score  0-No pain    Pain Onset  1 to 4 weeks ago         [facility] PT Assessment - 06/18/18 1034      Assessment   Medical Diagnosis  M54.16 (ICD-10-CM) - Lumbar radiculopathy; M25.561 (ICD-10-CM) - Acute pain of right knee     Referring Provider (PT)  Gregor Hams, MD                   [facility] Adult PT Treatment/Exercise - 06/18/18 1034      Lumbar Exercises: Stretches   Passive Hamstring Stretch  Right;Left;2 reps;30 seconds   supine  with strap; opp knee bent   Prone on Elbows Stretch  3 reps;60 seconds    Prone on Elbows Stretch Limitations  continuous    Piriformis Stretch  Right;Left;2 reps;30 seconds   opp knee bent   Other Lumbar Stretch Exercise  child's pose 2x20 sec      Lumbar Exercises: Aerobic   Nustep  L5: arms/legs x 6 min  for warm up      Lumbar Exercises: Seated   Long Arc Quad on [place]  Both;10 reps    Hip Flexion on [place]  Both;10 reps    Sit to Stand  10 reps    Sit to Stand Limitations  holding yellow medicine ball    Other Seated Lumbar Exercises  seated pelvic tilts 10 x 5 sec      Lumbar Exercises: Supine   Bridge  10 reps;5 seconds      Lumbar Exercises: Quadruped   [place] Horse  10 reps                  PT Long  Term Goals - 06/08/18 1350      PT LONG TERM GOAL #1   Title  independent with HEP    Status  New    Target Date  07/20/18      PT LONG TERM GOAL #2   Title  improve Rt hip strength to at least 4+/5 for improved function     Status  New    Target Date  07/20/18      PT LONG TERM GOAL #3   Title  report pain < 2/10 with activity x 1 week for improved function    Status  New    Target Date  07/20/18      PT LONG TERM GOAL #4   Title  ambulate with decreased deviations for improved functional mobility    Status  New    Target Date  07/20/18            Plan - 06/18/18 1147    Clinical Impression Statement  Pt reporting minimal radicular symptoms today and recommended she try prone on elbows if experiencing symptoms to assess for centralization.  Pt continues to do well with PT reporting significant improvment in function and pain overall.  Will continue to benefit from PT to address remaining deficits and decrease risk of reinjury.    Rehab Potential  Good    PT Frequency  2x / week    PT Duration  6 weeks    PT Treatment/Interventions  ADLs/Self Care Home Management;Electrical Stimulation;Cryotherapy;Iontophoresis 4mg /ml Dexamethasone;Moist  Heat;Traction;Ultrasound;Gait training;Stair training;Functional mobility training;Therapeutic activities;Neuromuscular re-education;Balance training;Therapeutic exercise;Patient/family education;Manual techniques;Dry needling;Taping    PT Next Visit Plan  reassess SIJ and treat PRN, continue with hip/core strengthening, manual/modalities/DN PRN    PT Home Exercise Plan  Access Code: [email or web address]    Consulted and Agree with Plan of Care  Patient       Patient will benefit from skilled therapeutic intervention in order to improve the following deficits and impairments:  Abnormal gait, Increased fascial restricitons, Increased muscle spasms, Decreased strength, Impaired flexibility, Pain, Postural dysfunction  Visit Diagnosis: Acute right-sided low back pain with right-sided sciatica  Acute pain of right knee  Other abnormalities of gait and mobility  Cramp and spasm     Problem List Patient Active Problem List   Diagnosis Date Noted  . Osteopenia 04/16/2018  . History of hysterectomy for benign disease 04/16/2018  . Mild persistent asthma without complication 76/73/4193  . Environmental allergies 04/16/2018  . Pelvic prolapse       Laureen Abrahams, PT, DPT 06/18/18 11:48 AM     [facility] [place], [place], [address] Phone: [phone]   Fax:  [phone]  Name: Marie Grimes MRN: [id number] Date of Birth: 07/28/1945

## 2018-06-22 ENCOUNTER — Ambulatory Visit: Payer: Medicare Other | Admitting: Physical Therapy

## 2018-06-22 ENCOUNTER — Encounter: Payer: Self-pay | Admitting: Physical Therapy

## 2018-06-22 DIAGNOSIS — R252 Cramp and spasm: Secondary | ICD-10-CM

## 2018-06-22 DIAGNOSIS — M25561 Pain in right knee: Secondary | ICD-10-CM

## 2018-06-22 DIAGNOSIS — R2689 Other abnormalities of gait and mobility: Secondary | ICD-10-CM | POA: Diagnosis not present

## 2018-06-22 DIAGNOSIS — M5441 Lumbago with sciatica, right side: Secondary | ICD-10-CM | POA: Diagnosis not present

## 2018-06-22 NOTE — Patient Instructions (Signed)

## 2018-06-22 NOTE — Therapy (Signed)
[place], [place], [address] Phone: [phone]   Fax:  [phone]  Physical Therapy Treatment  Patient Details  Name: Marie Grimes MRN: [id number] Date of Birth: 31-Dec-1945 Referring Provider (PT): Gregor Hams, MD   Encounter Date: 06/22/2018  PT End of Session - 06/22/18 1155    Visit Number  5    Number of Visits  12    Date for PT Re-Evaluation  07/20/18    PT Start Time  1030    PT Stop Time  1134    PT Time Calculation (min)  64 min    Activity Tolerance  Patient tolerated treatment well    Behavior During Therapy  [facility] for tasks assessed/performed       Past Medical History:  Diagnosis Date  . Acid reflux   . Allergy   . Asthma   . Environmental and seasonal allergies   . History of basal cell carcinoma   . History of squamous cell carcinoma   . Osteopenia after menopause   . Pelvic prolapse     Past Surgical History:  Procedure Laterality Date  . ABDOMINAL HYSTERECTOMY     age 73, endometriosis  . BREAST LUMPECTOMY Left    age 73, benign  . CERVICAL CONE BIOPSY    . ELBOW SURGERY    . FRACTURE SURGERY Left 2018   wrist  . REFRACTIVE SURGERY    . TONSILLECTOMY      There were no vitals filed for this visit.  Subjective Assessment - 06/22/18 1030    Subjective  has been working hard with exercises, and still having pain in hip and knee.  feels muscle tone is improving.    Patient Stated Goals  exercises, improve pain    Currently in Pain?  Yes    Pain Score  2     Pain Location  Buttocks    Pain Orientation  Right    Pain Descriptors / Indicators  Aching    Pain Type  Acute pain    Pain Radiating Towards  to knee/heel on Rt    Pain Onset  1 to 4 weeks ago    Pain Frequency  Intermittent    Aggravating Factors   unknown    Pain Relieving Factors  prednisone, gabapentin         [facility] PT Assessment - 06/22/18 1131      Assessment   Medical Diagnosis   M54.16 (ICD-10-CM) - Lumbar radiculopathy; M25.561 (ICD-10-CM) - Acute pain of right knee     Referring Provider (PT)  Gregor Hams, MD      Palpation   SI assessment   Rt ASIS, iliac crest and PSIS all lower than Lt    Palpation comment  trigger points into Rt ant tib and Rt QL, lumbar paraspinals      Special Tests    Special Tests  Leg LengthTest    Leg length test   True      True   Length  cm    Right  91.8 in.    Left   93.2 in.    Comments  added heel lift to Rt heel with improvement in symptoms and improved pelvis symmetries                   [facility] Adult PT Treatment/Exercise - 06/22/18 1032      Lumbar Exercises: Aerobic   Nustep  L5: arms/legs x 6 min  for warm up      Modalities   Modalities  Electrical Stimulation;Moist Heat      Moist Heat Therapy   Number Minutes Moist Heat  15 Minutes    Moist Heat Location  Lumbar Spine      Electrical Stimulation   Electrical Stimulation Location  Rt lumbar spine    Electrical Stimulation Action  IFC    Electrical Stimulation Parameters  to tolerance    Electrical Stimulation Goals  Pain      Manual Therapy   Manual Therapy  Soft tissue mobilization    Manual therapy comments  skilled palpation and monitoring of soft tissue during DN    Soft tissue mobilization  Rt ant tib and Rt QL into lumbar paraspinals       Trigger Point Dry Needling - 06/22/18 1150    Consent Given?  Yes    Education Handout Provided  Yes    Muscles Treated Upper Body  Quadratus Lumborum;Longissimus    Muscles Treated Lower Body  Tibialis anterior    Longissimus Response  Twitch response elicited;Palpable increased muscle length   lumbar paraspinals and QL on Rt   Tibialis Anterior Response  Twitch response elicited;Palpable increased muscle length           PT Education - 06/22/18 1155    Education Details  DN    Person(s) Educated  Patient    Methods  Explanation;Handout    Comprehension  Verbalized understanding           PT Long Term Goals - 06/08/18 1350      PT LONG TERM GOAL #1   Title  independent with HEP    Status  New    Target Date  07/20/18      PT LONG TERM GOAL #2   Title  improve Rt hip strength to at least 4+/5 for improved function     Status  New    Target Date  07/20/18      PT LONG TERM GOAL #3   Title  report pain < 2/10 with activity x 1 week for improved function    Status  New    Target Date  07/20/18      PT LONG TERM GOAL #4   Title  ambulate with decreased deviations for improved functional mobility    Status  New    Target Date  07/20/18            Plan - 06/22/18 1155    Clinical Impression Statement  Pt tolerated session well today and reported decreased pain and symptoms following session today.  Pt noted to have Rt ASIS, iliac crest and PSIS all lower than Lt therefore assessed leg length, noticing LLE longer than RLE.  Added heel lift with immediate improvement in symptoms, and then followed up with manual therapy and DN to address areas with active trigger points.  Pt will continue to benefit from PT to address deficits listed.    Rehab Potential  Good    PT Frequency  2x / week    PT Duration  6 weeks    PT Treatment/Interventions  ADLs/Self Care Home Management;Electrical Stimulation;Cryotherapy;Iontophoresis 4mg /ml Dexamethasone;Moist Heat;Traction;Ultrasound;Gait training;Stair training;Functional mobility training;Therapeutic activities;Neuromuscular re-education;Balance training;Therapeutic exercise;Patient/family education;Manual techniques;Dry needling;Taping    PT Next Visit Plan  reassess SIJ and treat PRN, continue with hip/core strengthening, manual/modalities/DN PRN    PT Home Exercise Plan  Access Code: [email or web address]    Consulted and Agree with Plan of Care  Patient  Patient will benefit from skilled therapeutic intervention in order to improve the following deficits and impairments:  Abnormal gait, Increased fascial restricitons,  Increased muscle spasms, Decreased strength, Impaired flexibility, Pain, Postural dysfunction  Visit Diagnosis: Acute right-sided low back pain with right-sided sciatica  Acute pain of right knee  Other abnormalities of gait and mobility  Cramp and spasm     Problem List Patient Active Problem List   Diagnosis Date Noted  . Osteopenia 04/16/2018  . History of hysterectomy for benign disease 04/16/2018  . Mild persistent asthma without complication 84/05/8207  . Environmental allergies 04/16/2018  . Pelvic prolapse       Laureen Abrahams, PT, DPT 06/22/18 11:58 AM    [facility] [place], [place], [address] Phone: [phone]   Fax:  [phone]  Name: Marie Grimes MRN: [id number] Date of Birth: Jan 10, 1946

## 2018-06-25 ENCOUNTER — Encounter: Payer: Self-pay | Admitting: Physical Therapy

## 2018-06-25 ENCOUNTER — Ambulatory Visit: Payer: Medicare Other | Admitting: Physical Therapy

## 2018-06-25 DIAGNOSIS — M25561 Pain in right knee: Secondary | ICD-10-CM | POA: Diagnosis not present

## 2018-06-25 DIAGNOSIS — R252 Cramp and spasm: Secondary | ICD-10-CM | POA: Diagnosis not present

## 2018-06-25 DIAGNOSIS — R2689 Other abnormalities of gait and mobility: Secondary | ICD-10-CM

## 2018-06-25 DIAGNOSIS — M5441 Lumbago with sciatica, right side: Secondary | ICD-10-CM | POA: Diagnosis not present

## 2018-06-25 NOTE — Therapy (Signed)
Marie Gr[place], [place], [address] Phone: [phone]   Fax:  [phone]  Physical Therapy Treatment  Patient Details  Name: Marie Grimes MRN: [id number] Date of Birth: 07-Aug-1945 Referring Provider (PT): Gregor Hams, MD   Encounter Date: 06/25/2018  PT End of Session - 06/25/18 1108    Visit Number  6    Number of Visits  12    Date for PT Re-Evaluation  07/20/18    PT Start Time  1030    PT Stop Time  1125    PT Time Calculation (min)  55 min    Activity Tolerance  Patient tolerated treatment well    Behavior During Therapy  [facility] for tasks assessed/performed       Past Medical History:  Diagnosis Date  . Acid reflux   . Allergy   . Asthma   . Environmental and seasonal allergies   . History of basal cell carcinoma   . History of squamous cell carcinoma   . Osteopenia after menopause   . Pelvic prolapse     Past Surgical History:  Procedure Laterality Date  . ABDOMINAL HYSTERECTOMY     age 73, endometriosis  . BREAST LUMPECTOMY Left    age 73, benign  . CERVICAL CONE BIOPSY    . ELBOW SURGERY    . FRACTURE SURGERY Left 2018   wrist  . REFRACTIVE SURGERY    . TONSILLECTOMY      There were no vitals filed for this visit.  Subjective Assessment - 06/25/18 1033    Subjective  had what she describes as a "deep shiver" Wednesday night.  only experiencing mild discomfort at this time.  felt DN and TENS were helpful.    Patient Stated Goals  exercises, improve pain    Pain Score  0-No pain                       [facility] Adult PT Treatment/Exercise - 06/25/18 1035      Lumbar Exercises: Stretches   Passive Hamstring Stretch  Right;Left;2 reps;30 seconds   supine with strap   Piriformis Stretch  Right;Left;2 reps;30 seconds      Lumbar Exercises: Aerobic   Nustep  L5: arms/legs x 6 min  for warm up      Lumbar Exercises: Supine   Bridge  10 reps;5 seconds    Bridge Limitations  x10 with strap for isometric hip abduction    Other Supine Lumbar Exercises  ab set with isometric hip abduction x 10 reps      Lumbar Exercises: Sidelying   Clam  Right;Left;10 reps;3 seconds      Modalities   Modalities  Electrical Stimulation;Moist Heat      Moist Heat Therapy   Number Minutes Moist Heat  15 Minutes    Moist Heat Location  Lumbar Spine      Electrical Stimulation   Electrical Stimulation Location  Rt lumbar spine    Electrical Stimulation Action  IFC    Electrical Stimulation Parameters  to tolerance    Electrical Stimulation Goals  Pain                  PT Long Term Goals - 06/08/18 1350      PT LONG TERM GOAL #1   Title  independent with HEP    Status  New    Target Date  07/20/18      PT LONG  TERM GOAL #2   Title  improve Rt hip strength to at least 4+/5 for improved function     Status  New    Target Date  07/20/18      PT LONG TERM GOAL #3   Title  report pain < 2/10 with activity x 1 week for improved function    Status  New    Target Date  07/20/18      PT LONG TERM GOAL #4   Title  ambulate with decreased deviations for improved functional mobility    Status  New    Target Date  07/20/18            Plan - 06/25/18 1108    Clinical Impression Statement  Pt reporting improved pain and symptoms following last session and continues to do well with PT.  Anticipate pt may be ready for d/c early if she continues to progress well.    Rehab Potential  Good    PT Frequency  2x / week    PT Duration  6 weeks    PT Treatment/Interventions  ADLs/Self Care Home Management;Electrical Stimulation;Cryotherapy;Iontophoresis 4mg /ml Dexamethasone;Moist Heat;Traction;Ultrasound;Gait training;Stair training;Functional mobility training;Therapeutic activities;Neuromuscular re-education;Balance training;Therapeutic exercise;Patient/family education;Manual techniques;Dry needling;Taping    PT Next Visit Plan  reassess SIJ and  treat PRN, continue with hip/core strengthening, manual/modalities/DN PRN    PT Home Exercise Plan  Access Code: [email or web address]    Consulted and Agree with Plan of Care  Patient       Patient will benefit from skilled therapeutic intervention in order to improve the following deficits and impairments:  Abnormal gait, Increased fascial restricitons, Increased muscle spasms, Decreased strength, Impaired flexibility, Pain, Postural dysfunction  Visit Diagnosis: Acute right-sided low back pain with right-sided sciatica  Acute pain of right knee  Other abnormalities of gait and mobility  Cramp and spasm     Problem List Patient Active Problem List   Diagnosis Date Noted  . Osteopenia 04/16/2018  . History of hysterectomy for benign disease 04/16/2018  . Mild persistent asthma without complication 39/76/7341  . Environmental allergies 04/16/2018  . Pelvic prolapse       Laureen Abrahams, PT, DPT 06/25/18 11:10 AM    [facility] [place], [place], [address] Phone: [phone]   Fax:  [phone]  Name: Marie Grimes MRN: [id number] Date of Birth: [birth year]

## 2018-06-29 ENCOUNTER — Encounter: Payer: Medicare Other | Admitting: Physical Therapy

## 2018-06-30 ENCOUNTER — Encounter: Payer: Self-pay | Admitting: Physical Therapy

## 2018-06-30 ENCOUNTER — Ambulatory Visit: Payer: Medicare Other | Admitting: Physical Therapy

## 2018-06-30 DIAGNOSIS — M25561 Pain in right knee: Secondary | ICD-10-CM | POA: Diagnosis not present

## 2018-06-30 DIAGNOSIS — R2689 Other abnormalities of gait and mobility: Secondary | ICD-10-CM | POA: Diagnosis not present

## 2018-06-30 DIAGNOSIS — R252 Cramp and spasm: Secondary | ICD-10-CM

## 2018-06-30 DIAGNOSIS — M5441 Lumbago with sciatica, right side: Secondary | ICD-10-CM

## 2018-06-30 NOTE — Therapy (Addendum)
[place], [place], [address] Phone: [phone]   Fax:  [phone]  Physical Therapy Treatment/Discharge  Patient Details  Name: Marie Grimes MRN: [id number] Date of Birth: 1945/12/22 Referring Provider (PT): Gregor Hams, MD   Encounter Date: 06/30/2018  PT End of Session - 06/30/18 1215    Visit Number  7    Number of Visits  12    Date for PT Re-Evaluation  07/20/18    PT Start Time  0962    PT Stop Time  1114    PT Time Calculation (min)  39 min    Activity Tolerance  Patient tolerated treatment well    Behavior During Therapy  [facility] for tasks assessed/performed       Past Medical History:  Diagnosis Date  . Acid reflux   . Allergy   . Asthma   . Environmental and seasonal allergies   . History of basal cell carcinoma   . History of squamous cell carcinoma   . Osteopenia after menopause   . Pelvic prolapse     Past Surgical History:  Procedure Laterality Date  . ABDOMINAL HYSTERECTOMY     age 29, endometriosis  . BREAST LUMPECTOMY Left    age 62, benign  . CERVICAL CONE BIOPSY    . ELBOW SURGERY    . FRACTURE SURGERY Left 2018   wrist  . REFRACTIVE SURGERY    . TONSILLECTOMY      There were no vitals filed for this visit.  Subjective Assessment - 06/30/18 1034    Subjective  doing exercises and feels like she's improved some.  not affecting function at this time.    Diagnostic tests  xrays Rt knee: negative    Patient Stated Goals  exercises, improve pain    Pain Score  0-No pain                       [facility] Adult PT Treatment/Exercise - 06/30/18 1035      Lumbar Exercises: Stretches   Quad Stretch  3 reps;30 seconds;Right;Left    Quad Stretch Limitations  sidelying with strap      Lumbar Exercises: Aerobic   Nustep  L5: arms/legs x 6 min       Manual Therapy   Manual Therapy  Soft tissue mobilization    Manual therapy comments  skilled  palpation and monitoring of soft tissue during DN    Soft tissue mobilization  Rt ant tib and fibularis muscles       Trigger Point Dry Needling - 06/30/18 1117    Consent Given?  Yes    Muscles Treated Lower Body  Tibialis anterior    Tibialis Anterior Response  Palpable increased muscle length   and fibularis muscle group               PT Long Term Goals - 06/30/18 1218      PT LONG TERM GOAL #1   Title  independent with HEP    Status  Achieved      PT LONG TERM GOAL #2   Title  improve Rt hip strength to at least 4+/5 for improved function     Status  On-going      PT LONG TERM GOAL #3   Title  report pain < 2/10 with activity x 1 week for improved function    Status  Partially Met      PT LONG  TERM GOAL #4   Title  ambulate with decreased deviations for improved functional mobility    Status  Achieved            Plan - 06/30/18 1218    Clinical Impression Statement  Pt is pleased with progress at this time, and is requesting to hold PT.  Some LTGs met at this time.      Rehab Potential  Good    PT Frequency  2x / week    PT Duration  6 weeks    PT Treatment/Interventions  ADLs/Self Care Home Management;Electrical Stimulation;Cryotherapy;Iontophoresis 78m/ml Dexamethasone;Moist Heat;Traction;Ultrasound;Gait training;Stair training;Functional mobility training;Therapeutic activities;Neuromuscular re-education;Balance training;Therapeutic exercise;Patient/family education;Manual techniques;Dry needling;Taping    PT Next Visit Plan  hold PT x 30 days, reassess and tx if pt returns, if not d/c    PT Home Exercise Plan  Access Code: [email or web address]   Consulted and Agree with Plan of Care  Patient       Patient will benefit from skilled therapeutic intervention in order to improve the following deficits and impairments:  Abnormal gait, Increased fascial restricitons, Increased muscle spasms, Decreased strength, Impaired flexibility, Pain, Postural  dysfunction  Visit Diagnosis: Acute right-sided low back pain with right-sided sciatica  Acute pain of right knee  Other abnormalities of gait and mobility  Cramp and spasm     Problem List Patient Active Problem List   Diagnosis Date Noted  . Osteopenia 04/16/2018  . History of hysterectomy for benign disease 04/16/2018  . Mild persistent asthma without complication 179/48/0165 . Environmental allergies 04/16/2018  . Pelvic prolapse       SLaureen Abrahams PT, DPT 06/30/18 12:21 PM    [facility] [place] [address]Phone: [phone]  Fax:  [phone] Name: Marie SALLASMRN: [id number] of Birth: [birth year]      PHYSICAL THERAPY DISCHARGE SUMMARY  Visits from Start of Care: 7  Current functional level related to goals / functional outcomes: See above   Remaining deficits: See above   Education / Equipment: HEP  Plan: Patient agrees to discharge.  Patient goals were met. Patient is being discharged due to being pleased with the current functional level.  ?????    SLaureen Abrahams PT, DPT 08/03/18 1:26 PM  [facility] [place] [address] [phone](office) [phone](fax)

## 2018-07-02 ENCOUNTER — Ambulatory Visit: Payer: Medicare Other | Admitting: Physician Assistant

## 2018-07-02 ENCOUNTER — Encounter: Payer: Self-pay | Admitting: Osteopathic Medicine

## 2018-07-02 ENCOUNTER — Encounter: Payer: Medicare Other | Admitting: Physical Therapy

## 2018-07-02 ENCOUNTER — Telehealth: Payer: Self-pay | Admitting: Osteopathic Medicine

## 2018-07-02 ENCOUNTER — Ambulatory Visit (INDEPENDENT_AMBULATORY_CARE_PROVIDER_SITE_OTHER): Payer: Medicare Other | Admitting: Family Medicine

## 2018-07-02 ENCOUNTER — Ambulatory Visit (INDEPENDENT_AMBULATORY_CARE_PROVIDER_SITE_OTHER): Payer: Medicare Other

## 2018-07-02 ENCOUNTER — Encounter: Payer: Self-pay | Admitting: Family Medicine

## 2018-07-02 ENCOUNTER — Ambulatory Visit (INDEPENDENT_AMBULATORY_CARE_PROVIDER_SITE_OTHER): Payer: Medicare Other | Admitting: Osteopathic Medicine

## 2018-07-02 VITALS — BP 148/79 | HR 66 | Temp 98.1°F | Wt 177.0 lb

## 2018-07-02 VITALS — BP 143/57 | HR 72 | Ht 66.0 in | Wt 177.0 lb

## 2018-07-02 DIAGNOSIS — Z9109 Other allergy status, other than to drugs and biological substances: Secondary | ICD-10-CM

## 2018-07-02 DIAGNOSIS — M5416 Radiculopathy, lumbar region: Secondary | ICD-10-CM | POA: Diagnosis not present

## 2018-07-02 DIAGNOSIS — Z90710 Acquired absence of both cervix and uterus: Secondary | ICD-10-CM

## 2018-07-02 DIAGNOSIS — M5137 Other intervertebral disc degeneration, lumbosacral region: Secondary | ICD-10-CM | POA: Diagnosis not present

## 2018-07-02 DIAGNOSIS — Z8739 Personal history of other diseases of the musculoskeletal system and connective tissue: Secondary | ICD-10-CM | POA: Diagnosis not present

## 2018-07-02 DIAGNOSIS — J4530 Mild persistent asthma, uncomplicated: Secondary | ICD-10-CM

## 2018-07-02 DIAGNOSIS — Z0000 Encounter for general adult medical examination without abnormal findings: Secondary | ICD-10-CM

## 2018-07-02 NOTE — Telephone Encounter (Signed)
Forwarding to provider for review.

## 2018-07-02 NOTE — Progress Notes (Signed)
Marie Grimes is a 73 y.o. female who presents to [facility] today for right leg pain.  Marie Grimes was seen by me on June 01, 2018 for right leg pain.  During the visit she was thought to have right L5 radiculopathy.  Knee x-ray was significant only for mild DJD.  In the interim she has had a significant course of physical therapy.  She notes significant improvement in pain.  She notes that she still has some pain especially in the right lateral calf with activity but is a lot better.  When neck steps including MRI and epidural steroid injection discussed patient says that she might be on the cusp of considering that but right now would like to continue home exercise program after completing physical therapy.    ROS:  As above  Exam:  BP (!) 143/57   Pulse 72   Ht 5\' 6"  (1.676 m)   Wt 177 lb (80.3 kg)   BMI 28.57 kg/m  Wt Readings from Last 5 Encounters:  07/02/18 177 lb (80.3 kg)  06/01/18 183 lb (83 kg)  04/16/18 179 lb (81.2 kg)   General: Well Developed, well nourished, and in no acute distress.  Neuro/Psych: Alert and oriented x3, extra-ocular muscles intact, able to move all 4 extremities, sensation grossly intact. Skin: Warm and dry, no rashes noted.  Respiratory: Not using accessory muscles, speaking in full sentences, trachea midline.  Cardiovascular: Pulses palpable, no extremity edema. Abdomen: Does not appear distended. MSK:  L-spine: Normal motion. 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  No acute fractures or significant malalignment. Await formal radiology review    Assessment and Plan: 73 y.o. female with right L5 radiculopathy.  Plan for x-ray as above, and continued home exercise program.  We will proceed with further physical therapy as needed.  If symptoms not improving or if  worsening sufficient to make sense for MRI or epidural steroid injection we will proceed with that next.  Next step would be MRI for epidural steroid injection planning.   I spent 15 minutes with this patient, greater than 50% was face-to-face time counseling regarding differential diagnosis treatment plan and options..  Orders Placed This Encounter  Procedures  . DG Lumbar Spine Complete    Standing Status:   Future    Standing Expiration Date:   08/31/2019    Order Specific Question:   Reason for Exam (SYMPTOM  OR DIAGNOSIS REQUIRED)    Answer:   eval low back with likely rt L5 radicuopathy    Order Specific Question:   Preferred imaging location?    Answer:   [facility]    Order Specific Question:   Radiology Contrast Protocol - do NOT remove file path    Answer:   [email or web address]   No orders of the defined types were placed in this encounter.   Historical information moved to improve visibility of documentation.  Past Medical History:  Diagnosis Date  . Acid reflux   . Allergy   . Asthma   . Environmental and seasonal allergies   . History of basal cell carcinoma   . History of squamous cell carcinoma   . Osteopenia after menopause   . Pelvic prolapse    Past Surgical History:  Procedure Laterality Date  . ABDOMINAL HYSTERECTOMY     age 56, endometriosis  . BREAST LUMPECTOMY Left  age 58, benign  . CERVICAL CONE BIOPSY    . ELBOW SURGERY    . FRACTURE SURGERY Left 2018   wrist  . REFRACTIVE SURGERY    . TONSILLECTOMY     Social History   Tobacco Use  . Smoking status: Never Smoker  . Smokeless tobacco: Never Used  Substance Use Topics  . Alcohol use: Yes    Alcohol/week: 5.0 - 7.0 standard drinks    Types: 5 - 7 Standard drinks or equivalent per week   family history includes Celiac disease in her brother; Diabetes in her brother; Hypertension in her mother; Stroke in her father and  mother.  Medications: Current Outpatient Medications  Medication Sig Dispense Refill  . ADVAIR DISKUS 100-50 MCG/DOSE AEPB     . albuterol (PROVENTIL HFA;VENTOLIN HFA) 108 (90 BASE) MCG/ACT inhaler Inhale into the lungs.    . Calcium Carb-Cholecalciferol (CALCIUM PLUS D3 ABSORBABLE PO) Take by mouth.    . fluticasone (FLONASE) 50 MCG/ACT nasal spray     . gabapentin (NEURONTIN) 300 MG capsule One tab PO qHS for a week, then BID for a week, then TID. May double weekly to a max of 3,600mg /day 180 capsule 3  . Glucosamine HCl (GLUCOSAMINE PO) Take by mouth.    . ibandronate (BONIVA) 150 MG tablet TAKE 1 TABLET BY MOUTH MONTHLY  3  . levocetirizine (XYZAL) 5 MG tablet     . Multiple Vitamins-Minerals (WOMENS MULTIVITAMIN PO) Take by mouth.    . Nutritional Supplements (ESTROVEN PO) Take by mouth.    Marland Kitchen omeprazole (PRILOSEC) 40 MG capsule     . predniSONE (DELTASONE) 50 MG tablet Take 1 tablet (50 mg total) by mouth daily. 5 tablet 0  . XIIDRA 5 % SOLN Place 1 drop into both eyes 2 (two) times daily.  10   No current facility-administered medications for this visit.    Allergies  Allergen Reactions  . Pneumococcal Polysaccharide Vaccine Other (See Comments)    muscle spasms and cramping  . Pertussis Vaccines Other (See Comments)    Fever, injection site reaction      Discussed warning signs or symptoms. Please see discharge instructions. Patient expresses understanding.

## 2018-07-02 NOTE — Telephone Encounter (Signed)
Lab orders are in

## 2018-07-02 NOTE — Progress Notes (Signed)
HPI: Marie Grimes is a 73 y.o. female who  has a past medical history of Acid reflux, Allergy, Asthma, Environmental and seasonal allergies, History of basal cell carcinoma, History of squamous cell carcinoma, Osteopenia after menopause, and Pelvic prolapse.  she presents to [facility] today, 07/02/18,  for chief complaint of:  Establish care - previously was scheduled w/ Evlyn Clines in error  Nohting major going on today. Evlyn Clines did a lot of the heavy lifting last visit.  I know have mammo results and Dexa results. Normal bone density, pt inquires if she can stop Boniva.   BP borderline. Pt has no hx HTN. Never been on meds. No CP/SOB.   BP Readings from Last 3 Encounters:  07/02/18 (!) 148/79  07/02/18 (!) 143/57  06/01/18 (!) 158/80     At today's visit 07/02/18 ... PMH, PSH, FH reviewed and updated as needed.  Current medication list and allergy/intolerance hx reviewed and updated as needed. (See remainder of HPI, ROS, Phys Exam below)           ASSESSMENT/PLAN: The primary encounter diagnosis was History of osteopenia. Diagnoses of History of hysterectomy for benign disease, Mild persistent asthma without complication, and Environmental allergies were also pertinent to this visit.   Chronic issues stable  No refills needed  Patient Instructions   Can stop Boniva and repeat bone density test in 2 years  Let pharmacy know when you need refills or can use the MyChart portal  Plan for Medicare visit w/ Maudie Mercury and labs in August, and annual physical exam with me sometime after that (can be same day but we would not have the lab results back that quickly).       Follow-up plan: Return for  medicare visit w/ Maudie Mercury, labs, and annual physical w/ Dr A around  01/2019.                                                 ################################################# ################################################# ################################################# #################################################    Current Meds  Medication Sig  . ADVAIR DISKUS 100-50 MCG/DOSE AEPB   . albuterol (PROVENTIL HFA;VENTOLIN HFA) 108 (90 BASE) MCG/ACT inhaler Inhale into the lungs.  . Calcium Carb-Cholecalciferol (CALCIUM PLUS D3 ABSORBABLE PO) Take by mouth.  . fluticasone (FLONASE) 50 MCG/ACT nasal spray   . Glucosamine HCl (GLUCOSAMINE PO) Take by mouth.  . levocetirizine (XYZAL) 5 MG tablet   . Multiple Vitamins-Minerals (WOMENS MULTIVITAMIN PO) Take by mouth.  . Nutritional Supplements (ESTROVEN PO) Take by mouth.  Marland Kitchen omeprazole (PRILOSEC) 40 MG capsule   . XIIDRA 5 % SOLN Place 1 drop into both eyes 2 (two) times daily.  . [DISCONTINUED] gabapentin (NEURONTIN) 300 MG capsule One tab PO qHS for a week, then BID for a week, then TID. May double weekly to a max of 3,600mg /day  . [DISCONTINUED] ibandronate (BONIVA) 150 MG tablet TAKE 1 TABLET BY MOUTH MONTHLY  . [DISCONTINUED] predniSONE (DELTASONE) 50 MG tablet Take 1 tablet (50 mg total) by mouth daily.    Allergies  Allergen Reactions  . Pneumococcal Polysaccharide Vaccine Other (See Comments)    muscle spasms and cramping  . Other   . Pertussis Vaccines Other (See Comments)    Fever, injection site reaction       Review of Systems:  Constitutional: No recent illness  HEENT: No  headache,  no vision change  Cardiac: No  chest pain, No  pressure, No palpitations  Respiratory:  No  shortness of breath. No  Cough  Gastrointestinal: No  abdominal pain, no change on bowel habits  Musculoskeletal: No new myalgia/arthralgia  Skin: No  Rash  Hem/Onc: No  easy bruising/bleeding, No  abnormal lumps/bumps  Neurologic: No  weakness, No   Dizziness  Psychiatric: No  concerns with depression, No  concerns with anxiety  Exam:  BP (!) 143/57 (BP Location: Left Arm, Patient Position: Sitting, Cuff Size: Normal)   Pulse 72   Wt 177 lb (80.3 kg)   BMI 28.57 kg/m   Constitutional: VS see above. General Appearance: alert, well-developed, well-nourished, NAD  Eyes: Normal lids and conjunctive, non-icteric sclera  Ears, Nose, Mouth, Throat: MMM, Normal external inspection ears/nares/mouth/lips/gums.  Neck: No masses, trachea midline.   Respiratory: Normal respiratory effort. no wheeze, no rhonchi, no rales  Cardiovascular: S1/S2 normal, no murmur, no rub/gallop auscultated. RRR.   Musculoskeletal: Gait normal. Symmetric and independent movement of all extremities  Abdominal: non-tender, non-distended, no appreciable organomegaly, neg Murphy's, BS WNLx4  Neurological: Normal balance/coordination. No tremor.  Skin: warm, dry, intact.   Psychiatric: Normal judgment/insight. Normal mood and affect. Oriented x3.       Visit summary with medication list and pertinent instructions was printed for patient to review, patient was advised to alert [place] if any updates are needed. All questions at time of visit were answered - patient instructed to contact office with any additional concerns. ER/RTC precautions were reviewed with the patient and understanding verbalized.   Note: Total time spent 25 minutes, greater than 50% of the visit was spent face-to-face counseling and coordinating care for the following: The primary encounter diagnosis was History of osteopenia. Diagnoses of History of hysterectomy for benign disease, Mild persistent asthma without complication, and Environmental allergies were also pertinent to this visit.Marland Kitchen  Please note: voice recognition software was used to produce this document, and typos may escape review. Please contact Dr. Sheppard Coil for any needed clarifications.    Follow up plan: Return for  medicare  visit w/ Maudie Mercury, labs, and annual physical w/ Dr A around 01/2019.

## 2018-07-02 NOTE — Patient Instructions (Addendum)
   Can stop Boniva and repeat bone density test in 2 years  Let pharmacy know when you need refills or can use the MyChart portal  Plan for Medicare visit w/ Maudie Mercury and labs in August, and annual physical exam with me sometime after that (can be same day but we would not have the lab results back that quickly).

## 2018-07-02 NOTE — Telephone Encounter (Signed)
Ok thanks 

## 2018-07-02 NOTE — Patient Instructions (Signed)
Thank you for coming in today.  Get xray.  Continue home exercises.  If pain is not improving we will likely proceed to MRI and then to injections.  Let me know if the pain does not get better enough.  I can order a MRI without seeing you again if not changing or significantly worse.   Epidural Steroid Injection Patient Information  Description: The epidural space surrounds the nerves as they exit the spinal cord.  In some patients, the nerves can be compressed and inflamed by a bulging disc or a tight spinal canal (spinal stenosis).  By injecting steroids into the epidural space, we can bring irritated nerves into direct contact with a potentially helpful medication.  These steroids act directly on the irritated nerves and can reduce swelling and inflammation which often leads to decreased pain.  Epidural steroids may be injected anywhere along the spine and from the neck to the low back depending upon the location of your pain.   After numbing the skin with local anesthetic (like Novocaine), a small needle is passed into the epidural space slowly.  You may experience a sensation of pressure while this is being done.  The entire block usually last less than 10 minutes.  Conditions which may be treated by epidural steroids:   Low back and leg pain  Neck and arm pain  Spinal stenosis  Post-laminectomy syndrome  Herpes zoster (shingles) pain  Pain from compression fractures  Preparation for the injection:  1. Do not eat any solid food or dairy products within 8 hours of your appointment.  2. You may drink clear liquids up to 3 hours before appointment.  Clear liquids include water, black coffee, juice or soda.  No milk or cream please. 3. You may take your regular medication, including pain medications, with a sip of water before your appointment  Diabetics should hold regular insulin (if taken separately) and take 1/2 normal NPH dos the morning of the procedure.  Carry some sugar  containing items with you to your appointment. 4. A driver must accompany you and be prepared to drive you home after your procedure.  5. Bring all your current medications with your. 6. An IV may be inserted and sedation may be given at the discretion of the physician.   7. A blood pressure cuff, EKG and other monitors will often be applied during the procedure.  Some patients may need to have extra oxygen administered for a short period. 8. You will be asked to provide medical information, including your allergies, prior to the procedure.  We must know immediately if you are taking blood thinners (like Coumadin/Warfarin)  Or if you are allergic to IV iodine contrast (dye). We must know if you could possible be pregnant.  Possible side-effects:  Bleeding from needle site  Infection (rare, may require surgery)  Nerve injury (rare)  Numbness & tingling (temporary)  Difficulty urinating (rare, temporary)  Spinal headache ( a headache worse with upright posture)  Light -headedness (temporary)  Pain at injection site (several days)  Decreased blood pressure (temporary)  Weakness in arm/leg (temporary)  Pressure sensation in back/neck (temporary)  Call if you experience:  Fever/chills associated with headache or increased back/neck pain.  Headache worsened by an upright position.  New onset weakness or numbness of an extremity below the injection site  Hives or difficulty breathing (go to the emergency room)  Inflammation or drainage at the infection site  Severe back/neck pain  Any new symptoms which are concerning  to you  Please note:  Although the local anesthetic injected can often make your back or neck feel good for several hours after the injection, the pain will likely return.  It takes 3-7 days for steroids to work in the epidural space.  You may not notice any pain relief for at least that one week.  If effective, we will often do a series of three injections  spaced 3-6 weeks apart to maximally decrease your pain.  After the initial series, we generally will wait several months before considering a repeat injection of the same type.

## 2018-07-02 NOTE — Telephone Encounter (Signed)
Marie Grimes has a Medicare Wellness Exam with Maudie Mercury on 2/5.  She would like to have lab order at  the time of her appointment with Maudie Mercury so she can get results when she has her Physical on 2/7.  Thanks.

## 2018-07-05 NOTE — Telephone Encounter (Signed)
Thank you :)

## 2018-07-05 NOTE — Telephone Encounter (Signed)
Pt has been updated. FYI - Her annual appt will be in August.

## 2018-07-15 ENCOUNTER — Encounter: Payer: Self-pay | Admitting: Osteopathic Medicine

## 2018-07-15 ENCOUNTER — Encounter: Payer: Self-pay | Admitting: Family Medicine

## 2018-07-15 DIAGNOSIS — M5416 Radiculopathy, lumbar region: Secondary | ICD-10-CM

## 2018-07-16 ENCOUNTER — Telehealth: Payer: Self-pay | Admitting: Osteopathic Medicine

## 2018-07-16 DIAGNOSIS — Z1211 Encounter for screening for malignant neoplasm of colon: Secondary | ICD-10-CM

## 2018-07-16 NOTE — Telephone Encounter (Signed)
Need cologuard order

## 2018-07-21 ENCOUNTER — Encounter: Payer: Self-pay | Admitting: Family Medicine

## 2018-07-22 ENCOUNTER — Encounter: Payer: Self-pay | Admitting: Osteopathic Medicine

## 2018-07-24 ENCOUNTER — Encounter: Payer: Self-pay | Admitting: Osteopathic Medicine

## 2018-07-26 MED ORDER — FLUTICASONE-SALMETEROL 100-50 MCG/DOSE IN AEPB: 1.0000 | INHALATION_SPRAY | Freq: Two times a day (BID) | RESPIRATORY_TRACT | 99 refills | Status: DC

## 2018-07-26 MED ORDER — ALBUTEROL SULFATE HFA 108 (90 BASE) MCG/ACT IN AERS: 1.0000 | INHALATION_SPRAY | RESPIRATORY_TRACT | 99 refills | Status: DC | PRN

## 2018-07-26 MED ORDER — OMEPRAZOLE 40 MG PO CPDR: 40.0000 mg | DELAYED_RELEASE_CAPSULE | Freq: Every day | ORAL | 3 refills | Status: DC

## 2018-07-26 NOTE — Telephone Encounter (Signed)
Last entered by historical provider, routing to PCP

## 2018-07-26 NOTE — Addendum Note (Signed)
Addended by: Maryla Morrow on: 07/26/2018 10:24 AM   Modules accepted: Orders

## 2018-07-29 LAB — COLOGUARD: Cologuard: POSITIVE — AB

## 2018-08-01 ENCOUNTER — Other Ambulatory Visit: Payer: Medicare Other

## 2018-08-02 ENCOUNTER — Ambulatory Visit (INDEPENDENT_AMBULATORY_CARE_PROVIDER_SITE_OTHER): Payer: Medicare Other

## 2018-08-02 DIAGNOSIS — M5136 Other intervertebral disc degeneration, lumbar region: Secondary | ICD-10-CM | POA: Diagnosis not present

## 2018-08-02 DIAGNOSIS — M5416 Radiculopathy, lumbar region: Secondary | ICD-10-CM

## 2018-08-06 ENCOUNTER — Encounter: Payer: Self-pay | Admitting: Family Medicine

## 2018-08-06 ENCOUNTER — Telehealth: Payer: Self-pay | Admitting: Osteopathic Medicine

## 2018-08-06 ENCOUNTER — Encounter: Payer: Self-pay | Admitting: Osteopathic Medicine

## 2018-08-06 DIAGNOSIS — R195 Other fecal abnormalities: Secondary | ICD-10-CM

## 2018-08-06 HISTORY — DX: Other fecal abnormalities: R19.5

## 2018-08-06 NOTE — Telephone Encounter (Signed)
Please call patient: Cologuard test was positive. I am referring for diagnostic colonoscopy, she should hear back in the next week or so about scheduling this procedure.  Please call us if haven't heard back in the next week.

## 2018-08-08 ENCOUNTER — Encounter: Payer: Self-pay | Admitting: Family Medicine

## 2018-08-09 NOTE — Telephone Encounter (Signed)
Pt advised of results. She reports she cannot have a colonoscopy because she has a tortuous colon. Routing back to PCP.

## 2018-08-09 NOTE — Telephone Encounter (Signed)
Pt advised.

## 2018-08-09 NOTE — Telephone Encounter (Signed)
Would still go to GI, they could possibly order a camera-pill and/or consider sigmoidoscopy at least.

## 2018-08-10 ENCOUNTER — Encounter: Payer: Self-pay | Admitting: Gastroenterology

## 2018-08-10 ENCOUNTER — Encounter: Payer: Self-pay | Admitting: Osteopathic Medicine

## 2018-08-14 MED ORDER — OMEPRAZOLE 40 MG PO CPDR: 40.0000 mg | DELAYED_RELEASE_CAPSULE | Freq: Every day | ORAL | 3 refills | Status: DC

## 2018-08-14 MED ORDER — FLUTICASONE-SALMETEROL 100-50 MCG/DOSE IN AEPB: 1.0000 | INHALATION_SPRAY | Freq: Two times a day (BID) | RESPIRATORY_TRACT | 99 refills | Status: DC

## 2018-08-14 MED ORDER — LEVOCETIRIZINE DIHYDROCHLORIDE 5 MG PO TABS: 5.0000 mg | ORAL_TABLET | Freq: Every day | ORAL | 3 refills | Status: DC

## 2018-08-14 MED ORDER — FLUTICASONE PROPIONATE 50 MCG/ACT NA SUSP: 2.0000 | Freq: Every day | NASAL | 3 refills | Status: DC

## 2018-08-17 ENCOUNTER — Ambulatory Visit (INDEPENDENT_AMBULATORY_CARE_PROVIDER_SITE_OTHER): Payer: Medicare Other | Admitting: Family Medicine

## 2018-08-17 ENCOUNTER — Encounter: Payer: Self-pay | Admitting: Family Medicine

## 2018-08-17 ENCOUNTER — Other Ambulatory Visit: Payer: Self-pay

## 2018-08-17 VITALS — BP 127/79 | HR 79 | Temp 97.4°F | Wt 179.3 lb

## 2018-08-17 DIAGNOSIS — M5416 Radiculopathy, lumbar region: Secondary | ICD-10-CM

## 2018-08-17 NOTE — Patient Instructions (Signed)
Thank you for coming in today.  Continue home exercises working on core strength.  We can re-order psychical therapy in the future if needed.  We can also order the back injections if needed in the future.  Recheck with me as needed.   Go to xray and get a CD of the pictures.

## 2018-08-17 NOTE — Progress Notes (Signed)
Marie Grimes is a 73 y.o. female who presents to [place] today for follow-up back pain and lumbar radicular pain.  Patient was seen in December in late January for back pain and thought to be right L5 radiculopathy.  She had an MRI in the interval and is here for follow-up.  She notes that her back pain and leg pain have resolved almost completely.  She notes some pain in her low back and some pain radiating down her right leg that she rates as 1-2 out of 10.  She is pretty happy with how things are going.  She would like to avoid injections if possible.  She continues her home exercises as learned in physical therapy.    ROS:  As above  Exam:  BP 127/79   Pulse 79   Temp (!) 97.4 F (36.3 C) (Oral)   Wt 179 lb 4.8 oz (81.3 kg)   BMI 28.94 kg/m  Wt Readings from Last 5 Encounters:  08/17/18 179 lb 4.8 oz (81.3 kg)  07/02/18 177 lb (80.3 kg)  07/02/18 177 lb (80.3 kg)  06/01/18 183 lb (83 kg)  04/16/18 179 lb (81.2 kg)   General: Well Developed, well nourished, and in no acute distress.  Neuro/Psych: Alert and oriented x3, extra-ocular muscles intact, able to move all 4 extremities, sensation grossly intact. Skin: Warm and dry, no rashes noted.  Respiratory: Not using accessory muscles, speaking in full sentences, trachea midline.  Cardiovascular: Pulses palpable, no extremity edema. Abdomen: Does not appear distended. MSK: L-spine: Nontender to midline normal motion normal gait.    Lab and Radiology Results Dg Lumbar Spine Complete  Result Date: 07/02/2018 CLINICAL DATA:  Low back pain with right L5 radiculopathy EXAM: LUMBAR SPINE - COMPLETE 4+ VIEW COMPARISON:  None. FINDINGS: There are 5 nonrib bearing lumbar-type vertebral bodies. The vertebral body heights are maintained. Minimal grade 1 anterolisthesis of L5 on S1 secondary to facet disease. There is no spondylolysis. There is no acute fracture. Mild degenerative disc  disease with disc height loss at L5-S1 with bilateral facet disease. The SI joints are unremarkable. IMPRESSION: Degenerative disc disease and facet arthropathy at L5-S1. Minimal grade 1 anterolisthesis of L5 on S1 secondary to facet disease. Electronically Signed   By: Kathreen Devoid   On: 07/02/2018 14:20   Dg Knee 1-2 Views Left  Result Date: 06/01/2018 CLINICAL DATA:  Right knee pain.  Left knee for comparison EXAM: LEFT KNEE - 1-2 VIEW COMPARISON:  Right knee performed today FINDINGS: Mild spurring along the tibial spines. Medial and lateral joint spaces appear maintained. No acute bony abnormality. IMPRESSION: Mild spurring along the tibial spines compatible with early osteoarthritis. Electronically Signed   By: Rolm Baptise M.D.   On: 06/01/2018 14:00   Mr Lumbar Spine Wo Contrast  Result Date: 08/02/2018 CLINICAL DATA:  Chronic low back pain on and off for years. EXAM: MRI LUMBAR SPINE WITHOUT CONTRAST TECHNIQUE: Multiplanar, multisequence MR imaging of the lumbar spine was performed. No intravenous contrast was administered. COMPARISON:  Radiographs 07/02/2018 FINDINGS: Segmentation: There are five lumbar type vertebral bodies. The last full intervertebral disc space is labeled L5-S1. This correlates with the radiographs. Alignment: Normal overall alignment. There is a very mild degenerative anterolisthesis of L5. Vertebrae: Normal marrow signal. No bone lesions or fractures. A few small scattered hemangiomas are noted. Conus medullaris and cauda equina: Conus extends to the L1 level. Conus and cauda equina appear normal. Paraspinal and other soft  tissues: No significant findings. Disc levels: T12-L1: No significant findings. L1-2: No significant findings. L2-3: Mild annular bulge but generous spinal canal. No significant spinal or foraminal stenosis. Mild facet disease. L3-4: Mild bulging annulus and mild facet disease but no disc protrusions, spinal or foraminal stenosis. L4-5: Bulging annulus and  mild to moderate facet disease there is slight flattening of the ventral thecal sac and mild bilateral lateral recess encroachment. No significant spinal or foraminal stenosis. L5-S1: Slight bulging uncovered disc and moderate to advanced facet disease but the spinal canal is generous in is no significant spinal or foraminal stenosis. IMPRESSION: 1. Mild degenerative lumbar spondylosis with multilevel disc disease and facet disease but no significant disc protrusions, spinal or foraminal stenosis. 2. Multilevel facet disease most notable in the lower lumbar spine but no definite pars defects. 3. No acute bony findings. Electronically Signed   By: Marijo Sanes M.D.   On: 08/02/2018 15:22   Dg Knee Complete 4 Views Right  Result Date: 06/01/2018 CLINICAL DATA:  Right knee pain EXAM: RIGHT KNEE - COMPLETE 4+ VIEW COMPARISON:  None. FINDINGS: Mild spurring along the tibial spines. Mild joint space narrowing and spurring in the patellofemoral compartment. No acute bony abnormality. Specifically, no fracture, subluxation, or dislocation. IMPRESSION: Early osteoarthritis.  No acute bony abnormality. Electronically Signed   By: Rolm Baptise M.D.   On: 06/01/2018 14:00   I personally (independently) visualized and performed the interpretation of the images attached in this note.     Assessment and Plan: 73 y.o. female with  Low back pain with right L5 radiculopathy.  Doing quite well nearly completely resolved.  Continue home exercise program and watchful waiting.  We will be happy to order epidural steroid or facet injection in the future if needed.  Recheck as needed.  I spent 15 minutes with this patient, greater than 50% was face-to-face time counseling regarding differential diagnosis treatment plan and options.Marland Kitchen    PDMP not reviewed this encounter. No orders of the defined types were placed in this encounter.  No orders of the defined types were placed in this encounter.   Historical  information moved to improve visibility of documentation.  Past Medical History:  Diagnosis Date  . Acid reflux   . Allergy   . Asthma   . Environmental and seasonal allergies   . History of basal cell carcinoma   . History of squamous cell carcinoma   . Osteopenia after menopause   . Pelvic prolapse   . Positive colorectal cancer screening using Cologuard test 08/06/2018   Referred 08/06/18 to GI   Past Surgical History:  Procedure Laterality Date  . ABDOMINAL HYSTERECTOMY     age 3, endometriosis  . BREAST LUMPECTOMY Left    age 52, benign  . CERVICAL CONE BIOPSY    . ELBOW SURGERY    . FRACTURE SURGERY Left 2018   wrist  . REFRACTIVE SURGERY    . TONSILLECTOMY     Social History   Tobacco Use  . Smoking status: Never Smoker  . Smokeless tobacco: Never Used  Substance Use Topics  . Alcohol use: Yes    Alcohol/week: 5.0 - 7.0 standard drinks    Types: 5 - 7 Standard drinks or equivalent per week   family history includes Celiac disease in her brother; Diabetes in her brother; Hypertension in her mother; Stroke in her father and mother.  Medications: Current Outpatient Medications  Medication Sig Dispense Refill  . albuterol (PROVENTIL HFA;VENTOLIN HFA) 108 (90  Base) MCG/ACT inhaler Inhale 1-2 puffs into the lungs every 4 (four) hours as needed for wheezing or shortness of breath. 2 Inhaler 99  . Calcium Carb-Cholecalciferol (CALCIUM PLUS D3 ABSORBABLE PO) Take by mouth.    . fluticasone (FLONASE) 50 MCG/ACT nasal spray Place 2 sprays into both nostrils daily. 48 g 3  . Fluticasone-Salmeterol (ADVAIR DISKUS) 100-50 MCG/DOSE AEPB Inhale 1 puff into the lungs 2 (two) times daily. 60 each 99  . Glucosamine HCl (GLUCOSAMINE PO) Take by mouth.    . levocetirizine (XYZAL) 5 MG tablet Take 1 tablet (5 mg total) by mouth daily. 90 tablet 3  . Multiple Vitamins-Minerals (WOMENS MULTIVITAMIN PO) Take by mouth.    . Nutritional Supplements (ESTROVEN PO) Take by mouth.    Marland Kitchen  omeprazole (PRILOSEC) 40 MG capsule Take 1 capsule (40 mg total) by mouth daily. 90 capsule 3  . XIIDRA 5 % SOLN Place 1 drop into both eyes 2 (two) times daily.  10   No current facility-administered medications for this visit.    Allergies  Allergen Reactions  . Pneumococcal Polysaccharide Vaccine Other (See Comments)    muscle spasms and cramping  . Other   . Pertussis Vaccines Other (See Comments)    Fever, injection site reaction      Discussed warning signs or symptoms. Please see discharge instructions. Patient expresses understanding.

## 2018-08-24 ENCOUNTER — Ambulatory Visit: Payer: Medicare Other | Admitting: Gastroenterology

## 2018-08-25 ENCOUNTER — Encounter: Payer: Self-pay | Admitting: Osteopathic Medicine

## 2018-08-26 MED ORDER — FLUTICASONE PROPIONATE 50 MCG/ACT NA SUSP: 2.0000 | Freq: Every day | NASAL | 3 refills | Status: DC

## 2018-08-26 MED ORDER — OMEPRAZOLE 40 MG PO CPDR: 40.0000 mg | DELAYED_RELEASE_CAPSULE | Freq: Every day | ORAL | 3 refills | Status: DC

## 2018-08-26 MED ORDER — FLUTICASONE-SALMETEROL 100-50 MCG/DOSE IN AEPB: 1.0000 | INHALATION_SPRAY | Freq: Two times a day (BID) | RESPIRATORY_TRACT | 99 refills | Status: DC

## 2018-08-26 MED ORDER — LEVOCETIRIZINE DIHYDROCHLORIDE 5 MG PO TABS: 5.0000 mg | ORAL_TABLET | Freq: Every day | ORAL | 3 refills | Status: DC

## 2018-09-07 ENCOUNTER — Encounter: Payer: Self-pay | Admitting: Osteopathic Medicine

## 2018-09-09 ENCOUNTER — Telehealth: Payer: Self-pay | Admitting: Osteopathic Medicine

## 2018-09-09 ENCOUNTER — Ambulatory Visit (INDEPENDENT_AMBULATORY_CARE_PROVIDER_SITE_OTHER): Payer: Medicare Other | Admitting: Osteopathic Medicine

## 2018-09-09 ENCOUNTER — Other Ambulatory Visit: Payer: Self-pay

## 2018-09-09 VITALS — BP 163/87 | HR 81 | Wt 174.0 lb

## 2018-09-09 DIAGNOSIS — R195 Other fecal abnormalities: Secondary | ICD-10-CM | POA: Diagnosis not present

## 2018-09-09 NOTE — Telephone Encounter (Signed)
Please call pt: Marie Grimes got back to me today. Pt does have a virtual visit with him scheduled for April 17th, I'd have pt confirm this with the [place] office to see what their process is for that (mychart or webex or phone or whatever). He did let me know a repeat colonoscopy is usually successful even in people with her history, but he will discuss other options with her as well. Hope this helps!

## 2018-09-09 NOTE — Progress Notes (Signed)
HPI: Marie Grimes is a 73 y.o. female who  has a past medical history of Acid reflux, Allergy, Asthma, Environmental and seasonal allergies, History of basal cell carcinoma, History of squamous cell carcinoma, Osteopenia after menopause, Pelvic prolapse, and Positive colorectal cancer screening using Cologuard test (08/06/2018).  she presents to [facility] via virtual WebEx visit today, 09/09/18,  for chief complaint of:  GI concern  Today:   Has been told she cannot come in to GI. She did inform them that she was anxious about the Cologuard. Uncertain if a message was routed to the physican or if she only received communication from the reception/non-clinical end.   She reports that the watery stools have subsided, was fairly heavy for 2 days but BM are back to normal now. Thinks may have been due to eating a lot of berries. Normal BM today and yesterday.   Hx barium enema in 05/30/2010, reports redundant colon, had sigmoidoscopy. This was at [facility]. No records available but pt reports she has a copy at home.    From MyChart message 2 days ago:  "I'm not sure exactly what is happening to me currently. I did test positive on the cologuard test and scheduled an appointment which has been pushed back twice by the doctor to whom you referred me due to the corona virus pandemic. Up to this point, other than occasional constipation, my bowel movements have been normal. However, yesterday, April 7, I woke up feeling shaky and started having frequent bowel urges which produced a watery stool. It is a normal color with no apparent bleeding and seems to a slowed a bit today with some soft stools along with the watery discharge. Additional symptoms include no discomfort except in the anal hemorrhoid area; bowel leakage if I bend over or am on my feet too long, and mediocre appetite. I sleep well."        At today's visit 09/09/18 ... PMH, PSH, FH reviewed  and updated as needed.  Current medication list and allergy/intolerance hx reviewed and updated as needed. (See remainder of HPI, ROS, Phys Exam below)   No results found.  No results found for this or any previous visit (from the past 72 hour(s)).        ASSESSMENT/PLAN: The encounter diagnosis was Positive colorectal cancer screening using Cologuard test.   Will route message to Dr Bryan Lemma, pt has appt scheduled for 09/17/2018 in Epic, but pt reports she was pushed ot longer than this so there may be some miscommunication.   Reminder set in Epic to followup on this next week if needed.   I advised patient that a (+)Cologuard is NOT a diagnosis of colon cancer, try not to worry until we have more information.       Follow-up plan: Return for annual physical in the fall when due, sooner if needed .                                                 ################################################# ################################################# ################################################# #################################################    Current Meds  Medication Sig  . albuterol (PROVENTIL HFA;VENTOLIN HFA) 108 (90 Base) MCG/ACT inhaler Inhale 1-2 puffs into the lungs every 4 (four) hours as needed for wheezing or shortness of breath.  . Calcium Carb-Cholecalciferol (CALCIUM PLUS D3 ABSORBABLE PO) Take by mouth.  . fluticasone (  FLONASE) 50 MCG/ACT nasal spray Place 2 sprays into both nostrils daily.  . Fluticasone-Salmeterol (ADVAIR DISKUS) 100-50 MCG/DOSE AEPB Inhale 1 puff into the lungs 2 (two) times daily.  Marland Kitchen levocetirizine (XYZAL) 5 MG tablet Take 1 tablet (5 mg total) by mouth daily.  . Multiple Vitamins-Minerals (WOMENS MULTIVITAMIN PO) Take by mouth.  . Nutritional Supplements (ESTROVEN PO) Take by mouth.  Marland Kitchen omeprazole (PRILOSEC) 40 MG capsule Take 1 capsule (40 mg total) by mouth daily.  Marland Kitchen XIIDRA 5 % SOLN  Place 1 drop into both eyes 2 (two) times daily.    Allergies  Allergen Reactions  . Pneumococcal Polysaccharide Vaccine Other (See Comments)    muscle spasms and cramping  . Other   . Pertussis Vaccines Other (See Comments)    Fever, injection site reaction       Review of Systems:  Constitutional: No recent illness, no fever/chills  HEENT: No  headache  Cardiac: No  chest pain  Respiratory:  No  shortness of breath.  Gastrointestinal: No  abdominal pain, +change on bowel habits per HPI  Psychiatric: No  concerns with depression, +concerns with anxiety  Exam:  BP (!) 163/87 (BP Location: Right Arm, Patient Position: Sitting, Cuff Size: Normal)   Pulse 81   Wt 174 lb (78.9 kg)   BMI 28.08 kg/m   Constitutional: VS see above. NAD  Psychiatric: Normal judgment/insight. Normal mood and affect. Oriented x3.       Visit summary with medication list and pertinent instructions was printed for patient to review, patient was advised to alert [place] if any updates are needed. All questions at time of visit were answered - patient instructed to contact office with any additional concerns. ER/RTC precautions were reviewed with the patient and understanding verbalized.   Note: Total time spent 25 minutes, greater than 50% of the visit was spent face-to-face counseling and coordinating care for the following: The encounter diagnosis was Positive colorectal cancer screening using Cologuard test..  Please note: voice recognition software was used to produce this document, and typos may escape review. Please contact Dr. Sheppard Coil for any needed clarifications.    Follow up plan: Return for annual physical in the fall when due, sooner if needed .

## 2018-09-09 NOTE — Telephone Encounter (Signed)
Pt has been updated of provider's note. No other inquiries during call.

## 2018-09-17 ENCOUNTER — Other Ambulatory Visit: Payer: Self-pay

## 2018-09-17 ENCOUNTER — Telehealth (INDEPENDENT_AMBULATORY_CARE_PROVIDER_SITE_OTHER): Payer: Medicare Other | Admitting: Gastroenterology

## 2018-09-17 ENCOUNTER — Encounter: Payer: Self-pay | Admitting: Gastroenterology

## 2018-09-17 VITALS — Ht 66.0 in | Wt 174.0 lb

## 2018-09-17 DIAGNOSIS — K5730 Diverticulosis of large intestine without perforation or abscess without bleeding: Secondary | ICD-10-CM

## 2018-09-17 DIAGNOSIS — R195 Other fecal abnormalities: Secondary | ICD-10-CM

## 2018-09-17 DIAGNOSIS — K640 First degree hemorrhoids: Secondary | ICD-10-CM

## 2018-09-17 DIAGNOSIS — Q438 Other specified congenital malformations of intestine: Secondary | ICD-10-CM

## 2018-09-17 DIAGNOSIS — K219 Gastro-esophageal reflux disease without esophagitis: Secondary | ICD-10-CM | POA: Diagnosis not present

## 2018-09-17 DIAGNOSIS — N819 Female genital prolapse, unspecified: Secondary | ICD-10-CM

## 2018-09-17 NOTE — Progress Notes (Signed)
Chief Complaint: Positive Cologuard  Referring Provider:     [name], DO   HPI:    Due to current restrictions/limitations of in-office visits due to the COVID-19 pandemic, this scheduled clinical appointment was converted to a telehealth virtual consultation using telephone.  -The patient did consent to this virtual visit and is aware of possible charges through their insurance for this visit.  -Names of all parties present: Marie Grimes (patient), Gerrit Heck, DO, [facility] (physician) -Patient location: Home -Physician location: Office  Marie Grimes is a 73 y.o. female referred to the Gastroenterology Clinic for evaluation of recent positive Cologuard test on 08/06/2018.  Does have intermittent constipation over the last year or so, treated with mineral oil. She is otherwise without lower GI symptoms, to include change in bowel habits, medication, melena.  Otherwise, normal p.o. intake, weight stable, no night sweats, fever, chills, nausea, vomiting.  Interestingly, she had an incomplete colonoscopy in 2011 due to tortuosity and redundancy.  Subsequent air-contrast barium enema study in 2011 also demonstrates a an "extremely redundant" colon, which demonstrated sigmoid diverticulosis and some retained stool but otherwise no mass or large polyps noted. Had a sigmoidoscopy in 2001 that she reports was normal.   Has pelvic pessary in place for prolapse.  This will need to be removed prior to colonoscopy.  Has follow-up scheduled with her GYN next week.  She does have a hx of hemorrhoids, with rare episodic BRBPR with large stools. Present for years and unchanged for many years.   Separately, she has a history of reflux which is generally well controlled with acid suppression therapy, omeprazole 40 mg daily. No dysphagia. Has had an EGD approx 15 years ago.   Past medical history, past surgical history, social history, family history, medications, and  allergies reviewed in the chart and with patient over Zoom.   Past Medical History:  Diagnosis Date  . Acid reflux   . Allergy   . Asthma   . Environmental and seasonal allergies   . History of basal cell carcinoma   . History of squamous cell carcinoma   . Osteopenia after menopause   . Pelvic prolapse   . Positive colorectal cancer screening using Cologuard test 08/06/2018   Referred 08/06/18 to GI     Past Surgical History:  Procedure Laterality Date  . ABDOMINAL HYSTERECTOMY     age 83, endometriosis  . BREAST LUMPECTOMY Left    age 32, benign  . CERVICAL CONE BIOPSY    . ELBOW SURGERY    . FRACTURE SURGERY Left 2018   wrist  . REFRACTIVE SURGERY    . TONSILLECTOMY     Family History  Problem Relation Age of Onset  . Hypertension Mother   . Stroke Mother   . Stroke Father   . Diabetes Brother   . Celiac disease Brother    Social History   Tobacco Use  . Smoking status: Never Smoker  . Smokeless tobacco: Never Used  Substance Use Topics  . Alcohol use: Yes    Alcohol/week: 5.0 - 7.0 standard drinks    Types: 5 - 7 Standard drinks or equivalent per week  . Drug use: Never   Current Outpatient Medications  Medication Sig Dispense Refill  . albuterol (PROVENTIL HFA;VENTOLIN HFA) 108 (90 Base) MCG/ACT inhaler Inhale 1-2 puffs into the lungs every 4 (four) hours as needed for wheezing or shortness of breath. 2 Inhaler 99  .  Calcium Carb-Cholecalciferol (CALCIUM PLUS D3 ABSORBABLE PO) Take by mouth.    . fluticasone (FLONASE) 50 MCG/ACT nasal spray Place 2 sprays into both nostrils daily. 48 g 3  . Fluticasone-Salmeterol (ADVAIR DISKUS) 100-50 MCG/DOSE AEPB Inhale 1 puff into the lungs 2 (two) times daily. 60 each 99  . levocetirizine (XYZAL) 5 MG tablet Take 1 tablet (5 mg total) by mouth daily. 90 tablet 3  . Multiple Vitamins-Minerals (WOMENS MULTIVITAMIN PO) Take by mouth.    . Nutritional Supplements (ESTROVEN PO) Take by mouth.    Marland Kitchen omeprazole (PRILOSEC) 40  MG capsule Take 1 capsule (40 mg total) by mouth daily. 90 capsule 3  . XIIDRA 5 % SOLN Place 1 drop into both eyes 2 (two) times daily.  10   No current facility-administered medications for this visit.    Allergies  Allergen Reactions  . Pneumococcal Polysaccharide Vaccine Other (See Comments)    muscle spasms and cramping  . Other   . Pertussis Vaccines Other (See Comments)    Fever, injection site reaction     Review of Systems: All systems reviewed and negative except where noted in HPI.     Physical Exam:    Physical exam not completed due to the nature of this telehealth communication.  Patient was otherwise alert and oriented and well communicative.   ASSESSMENT AND PLAN;   1) Positive Cologuard Test: 73 year old female with positive Cologuard test as part of routine CRC screening. Interestingly, she had an incomplete colonoscopy in 2011 due to tortuosity and redundancy.  Subsequent barium enema study in 2011 also demonstrates a redundant colon, but otherwise no mention of mass or large polyps.  Discussed that her tortuosity will likely make for a difficult repeat colonoscopy. However, in light of a +ColoGuard, I still recommend repeat colonoscopy as a gold standard test.  Historically, repeat colonoscopy is typically successful in 95% or so.  - Discussed options for CRC screening/positive Cologuard study to include repeat optical colonoscopy, virtual colonoscopy, repeat barium enema study and she opted to proceed with optical colonoscopy. - Schedule colonoscopy when able following current COVID-19 pandemic related restrictions - Plan for pediatric colonoscope, abdominal binder - Will need to have pessary removed prior to colonoscopy.  She has follow appointment with her GYN next week and can discuss this  2) Hemorrhoids: Non-bothersome and unchanged for many years.  3) GERD: Symptoms well controlled with PPI therapy.  No plan for change at this time.  4) Tortuous  colon/abnormal imaging study: Approach as above.  5) History of diverticulosis: No complications.  6) Prolapse: Has follow-up with her GYN next week.  Will need pessary removed prior to colonoscopy per manufacturer recommendation.  The indications, risks, and benefits of colonoscopy were explained to the patient in detail. Risks include but are not limited to bleeding, perforation, adverse reaction to medications, and cardiopulmonary compromise. Sequelae include but are not limited to the possibility of surgery, hospitalization, and mortality. The patient verbalized understanding and wished to proceed. All questions answered, referred to the scheduler and bowel prep ordered. Further recommendations pending results of the exam.    Lavena Bullion, DO, FACG  09/17/2018, 8:14 AM   [name], DO

## 2018-09-17 NOTE — Patient Instructions (Signed)
Discussed options for CRC screening/positive Cologuard study to include repeat optical colonoscopy, virtual colonoscopy, repeat barium enema study and she opted to proceed with optical colonoscopy.   Schedule colonoscopy when able following current COVID-19 pandemic related restrictions   Plan for pediatric colonoscope, abdominal binder   Will need to have pessary removed prior to colonoscopy.  She has follow appointment with her GYN next week and can discuss this  Thank You for choosing [facility]   Dr Bryan Lemma

## 2018-10-19 ENCOUNTER — Telehealth: Payer: Self-pay | Admitting: Gastroenterology

## 2018-10-19 ENCOUNTER — Encounter: Payer: Self-pay | Admitting: Gastroenterology

## 2018-10-19 NOTE — Telephone Encounter (Signed)
The pt was advised via voice mail to call and make a telephone appt to discuss colonoscopy.

## 2018-10-19 NOTE — Telephone Encounter (Signed)
Per Dr.Cirigliano okay to schedule colonoscopy with previsit. Patient has been scheduled for 11/04/2018 at 2pm with telephone previsit on 10/26/2018.

## 2018-10-19 NOTE — Telephone Encounter (Signed)
Pt calling looking to schedule a colonoscopy as she has a positive cologuard test. Pls advise if it is ok to schedule pt for procedure.

## 2018-10-19 NOTE — Telephone Encounter (Signed)
Yes, ok to schedule. At the pre-op appointment, can we please make the following notes:  - Confirm she has had the pessary removed by Gyn - Make note to have pediatric colonoscope and abdominal binder set up for the case.   Thank you.

## 2018-10-19 NOTE — Telephone Encounter (Signed)
Patient would like to know if she can be scheduled for a colonoscopy or does she need to have another Virtual visit.

## 2018-10-19 NOTE — Telephone Encounter (Signed)
Dr Bryan Lemma can you please review and advise if pt can be scheduled for colonoscopy directly?

## 2018-10-26 ENCOUNTER — Ambulatory Visit (AMBULATORY_SURGERY_CENTER): Payer: Self-pay

## 2018-10-26 ENCOUNTER — Other Ambulatory Visit: Payer: Self-pay

## 2018-10-26 ENCOUNTER — Encounter: Payer: Self-pay | Admitting: Gastroenterology

## 2018-10-26 VITALS — Ht 66.0 in | Wt 174.0 lb

## 2018-10-26 DIAGNOSIS — R195 Other fecal abnormalities: Secondary | ICD-10-CM

## 2018-10-26 MED ORDER — NA SULFATE-K SULFATE-MG SULF 17.5-3.13-1.6 GM/177ML PO SOLN: 1.0000 | Freq: Once | ORAL | 0 refills | Status: AC

## 2018-10-26 NOTE — Progress Notes (Signed)
Denies allergies to eggs or soy products. Denies complication of anesthesia or sedation. Denies use of weight loss medication. Denies use of O2.   Emmi instructions given for colonoscopy.   Pre-Visit was conducted by phone due to Covid 19. Instructions were reviewed with patient and mailed to confirmed address. Patient was encouraged to call if she had any questions or concerns if she had any questions regarding instructions.

## 2018-11-02 ENCOUNTER — Telehealth: Payer: Self-pay | Admitting: *Deleted

## 2018-11-02 NOTE — Telephone Encounter (Signed)
Covid-19 screening questions  Have you traveled in the last 14 days?no If yes where?  Do you now or have you had a fever in the last 14 days?no  Do you have any respiratory symptoms of shortness of breath or cough now or in the last 14 days?no  Do you have any family members or close contacts with diagnosed or suspected Covid-19 in the past 14 days?no  Have you been tested for Covid-19 and found to be positive?no  Pt aware of [facility] policy and will bring a mask with her. SM

## 2018-11-04 ENCOUNTER — Other Ambulatory Visit: Payer: Self-pay

## 2018-11-04 ENCOUNTER — Encounter: Payer: Self-pay | Admitting: Gastroenterology

## 2018-11-04 ENCOUNTER — Ambulatory Visit (AMBULATORY_SURGERY_CENTER): Payer: Medicare Other | Admitting: Gastroenterology

## 2018-11-04 VITALS — BP 132/63 | HR 69 | Resp 13

## 2018-11-04 DIAGNOSIS — R195 Other fecal abnormalities: Secondary | ICD-10-CM

## 2018-11-04 DIAGNOSIS — K635 Polyp of colon: Secondary | ICD-10-CM

## 2018-11-04 DIAGNOSIS — D123 Benign neoplasm of transverse colon: Secondary | ICD-10-CM

## 2018-11-04 DIAGNOSIS — D129 Benign neoplasm of anus and anal canal: Secondary | ICD-10-CM

## 2018-11-04 DIAGNOSIS — K621 Rectal polyp: Secondary | ICD-10-CM

## 2018-11-04 DIAGNOSIS — D128 Benign neoplasm of rectum: Secondary | ICD-10-CM

## 2018-11-04 DIAGNOSIS — K5730 Diverticulosis of large intestine without perforation or abscess without bleeding: Secondary | ICD-10-CM

## 2018-11-04 MED ORDER — SODIUM CHLORIDE 0.9 % IV SOLN: 500.0000 mL | Freq: Once | INTRAVENOUS | Status: DC

## 2018-11-04 NOTE — Progress Notes (Signed)
Temp Faith Vitals JB  Pt's states no medical or surgical changes since previsit or office visit.

## 2018-11-04 NOTE — Patient Instructions (Signed)
3 polyps removed.  Diverticulosis noted. Wait for pathology results.  Usually in the form of a letter per MD in your mailbox in 1-3 weeks. Please call us if no letter within 3 weeks.   YOU HAD AN ENDOSCOPIC PROCEDURE TODAY AT [facility]:   Refer to the procedure report that was given to you for any specific questions about what was found during the examination.  If the procedure report does not answer your questions, please call your gastroenterologist to clarify.  If you requested that your care partner not be given the details of your procedure findings, then the procedure report has been included in a sealed envelope for you to review at your convenience later.  YOU SHOULD EXPECT: Some feelings of bloating in the abdomen. Passage of more gas than usual.  Walking can help get rid of the air that was put into your GI tract during the procedure and reduce the bloating. If you had a lower endoscopy (such as a colonoscopy or flexible sigmoidoscopy) you may notice spotting of blood in your stool or on the toilet paper. If you underwent a bowel prep for your procedure, you may not have a normal bowel movement for a few days.  Please Note:  You might notice some irritation and congestion in your nose or some drainage.  This is from the oxygen used during your procedure.  There is no need for concern and it should clear up in a day or so.  SYMPTOMS TO REPORT IMMEDIATELY:   Following lower endoscopy (colonoscopy or flexible sigmoidoscopy):  Excessive amounts of blood in the stool  Significant tenderness or worsening of abdominal pains  Swelling of the abdomen that is new, acute  Fever of 100F or higher   For urgent or emergent issues, a gastroenterologist can be reached at any hour by calling [phone].   DIET:  We do recommend a small meal at first, but then you may proceed to your regular diet.  Drink plenty of fluids but you should avoid alcoholic beverages for 24  hours.  ACTIVITY:  You should plan to take it easy for the rest of today and you should NOT DRIVE or use heavy machinery until tomorrow (because of the sedation medicines used during the test).    FOLLOW UP: Our staff will call the number listed on your records 48-72 hours following your procedure to check on you and address any questions or concerns that you may have regarding the information given to you following your procedure. If we do not reach you, we will leave a message.  We will attempt to reach you two times.  During this call, we will ask if you have developed any symptoms of COVID 19. If you develop any symptoms (ie: fever, flu-like symptoms, shortness of breath, cough etc.) before then, please call [phone].  If you test positive for Covid 19 in the 2 weeks post procedure, please call and report this information to [place].    If any biopsies were taken you will be contacted by phone or by letter within the next 1-3 weeks.  Please call us at [phone] if you have not heard about the biopsies in 3 weeks.    SIGNATURES/CONFIDENTIALITY: You and/or your care partner have signed paperwork which will be entered into your electronic medical record.  These signatures attest to the fact that that the information above on your After Visit Summary has been reviewed and is understood.  Full responsibility of the confidentiality  of this discharge information lies with you and/or your care-partner.

## 2018-11-04 NOTE — Op Note (Signed)
[facility] Patient Name: Marie Grimes Procedure Date: 11/04/2018 1:56 PM MRN: [id number] Endoscopist: Gerrit Heck , MD Age: 73 Referring MD:  Date of Birth: [birth year] Gender: Female Account #: [id number] Procedure:                Colonoscopy Indications:              Positive Cologuard test Medicines:                Monitored Anesthesia Care Procedure:                Pre-Anesthesia Assessment:                           - Prior to the procedure, a History and Physical                            was performed, and patient medications and                            allergies were reviewed. The patient's tolerance of                            previous anesthesia was also reviewed. The risks                            and benefits of the procedure and the sedation                            options and risks were discussed with the patient.                            All questions were answered, and informed consent                            was obtained. Prior Anticoagulants: The patient has                            taken no previous anticoagulant or antiplatelet                            agents. ASA Grade Assessment: II - A patient with                            mild systemic disease. After reviewing the risks                            and benefits, the patient was deemed in                            satisfactory condition to undergo the procedure.                           After obtaining informed consent, the colonoscope  was passed under direct vision. Throughout the                            procedure, the patient's blood pressure, pulse, and                            oxygen saturations were monitored continuously. The                            Colonoscope was introduced through the anus and                            advanced to the the terminal ileum. The colonoscopy                            was technically difficult and  complex due to a                            tortuous colon. Successful completion of the                            procedure was aided by using manual pressure and                            placement of abdominal binder prior to starting                            case. The patient tolerated the procedure well. The                            quality of the bowel preparation was adequate. The                            ileocecal valve, appendiceal orifice, and rectum                            were photographed. Scope In: 2:13:33 PM Scope Out: 3:08:30 PM Scope Withdrawal Time: 0 hours 46 minutes 47 seconds  Total Procedure Duration: 0 hours 54 minutes 57 seconds  Findings:                 The perianal and digital rectal examinations were                            normal.                           Two flat polyps were found in the transverse colon.                            The polyps were 8 to 11 mm in size. These polyps                            were removed with a hot snare. Resection  and                            retrieval were complete. Estimated blood loss: none.                           A 4 mm polyp was found in the rectum. The polyp was                            sessile. The polyp was removed with a cold snare.                            Resection and retrieval were complete. Estimated                            blood loss was minimal.                           Multiple small and large-mouthed diverticula were                            found in the sigmoid colon, descending colon and                            ascending colon.                           Retroflexion in the right colon was performed.                           The sigmoid colon, descending colon and hepatic                            flexure were significantly tortuous. Advancing the                            scope required applying abdominal pressure adn                            watter irrigation. An  abdominal binder was placed                            prior to the start of the procedure to limit                            looping.                           Retroflexion in the rectum was not performed due to                            anatomy/narrowed rectal vault.                           The terminal ileum appeared normal. Complications:  No immediate complications. Estimated Blood Loss:     Estimated blood loss was minimal. Impression:               - Two 8 to 11 mm polyps in the transverse colon,                            removed with a hot snare. Resected and retrieved.                           - One 4 mm polyp in the rectum, removed with a cold                            snare. Resected and retrieved.                           - Diverticulosis in the sigmoid colon, in the                            descending colon and in the ascending colon.                           - Tortuous colon.                           - The examined portion of the ileum was normal. Recommendation:           - Patient has a contact number available for                            emergencies. The signs and symptoms of potential                            delayed complications were discussed with the                            patient. Return to normal activities tomorrow.                            Written discharge instructions were provided to the                            patient.                           - Resume previous diet today.                           - Continue present medications.                           - Await pathology results.                           - Repeat colonoscopy for surveillance based on  pathology results.                           - Return to GI office PRN. Gerrit Heck, MD 11/04/2018 3:22:41 PM

## 2018-11-04 NOTE — Progress Notes (Signed)
Called to room to assist during endoscopic procedure.  Patient ID and intended procedure confirmed with present staff. Received instructions for my participation in the procedure from the performing physician.  

## 2018-11-04 NOTE — Progress Notes (Signed)
A/ox3, pleased with MAC, report to RN 

## 2018-11-08 ENCOUNTER — Telehealth: Payer: Self-pay | Admitting: *Deleted

## 2018-11-08 ENCOUNTER — Telehealth: Payer: Self-pay

## 2018-11-08 NOTE — Telephone Encounter (Signed)
  Follow up Call-  Call back number 11/04/2018  Post procedure Call Back phone  # [phone]  Permission to leave phone message Yes  Some recent data might be hidden     Patient questions:  Message left to call us if necessary.

## 2018-11-08 NOTE — Telephone Encounter (Signed)
  Follow up Call-  Call back number 11/04/2018  Post procedure Call Back phone  # [phone]  Permission to leave phone message Yes  Some recent data might be hidden     Patient questions:  Do you have a fever, pain , or abdominal swelling? No. Pain Score  0 *  Have you tolerated food without any problems? Yes.    Have you been able to return to your normal activities? Yes.    Do you have any questions about your discharge instructions: Diet   No. Medications  No. Follow up visit  No.  Do you have questions or concerns about your Care? No.  Actions: * If pain score is 4 or above: No action needed, pain <4. 1. Have you developed a fever since your procedure? no  2.   Have you had an respiratory symptoms (SOB or cough) since your procedure? no  3.   Have you tested positive for COVID 19 since your procedure no  4.   Have you had any family members/close contacts diagnosed with the COVID 19 since your procedure?  no   If yes to any of these questions please route to Joylene John, RN and Alphonsa Gin, Therapist, sports.

## 2018-11-10 ENCOUNTER — Encounter: Payer: Self-pay | Admitting: Gastroenterology

## 2018-11-19 ENCOUNTER — Other Ambulatory Visit: Payer: Self-pay | Admitting: Osteopathic Medicine

## 2018-11-19 DIAGNOSIS — R195 Other fecal abnormalities: Secondary | ICD-10-CM

## 2019-01-04 ENCOUNTER — Ambulatory Visit: Payer: Medicare Other

## 2019-01-07 ENCOUNTER — Other Ambulatory Visit: Payer: Self-pay

## 2019-01-07 ENCOUNTER — Encounter: Payer: Self-pay | Admitting: Osteopathic Medicine

## 2019-01-07 ENCOUNTER — Ambulatory Visit (INDEPENDENT_AMBULATORY_CARE_PROVIDER_SITE_OTHER): Payer: Medicare Other | Admitting: Osteopathic Medicine

## 2019-01-07 VITALS — BP 138/75 | HR 68 | Temp 98.1°F | Wt 178.3 lb

## 2019-01-07 DIAGNOSIS — M8580 Other specified disorders of bone density and structure, unspecified site: Secondary | ICD-10-CM | POA: Diagnosis not present

## 2019-01-07 DIAGNOSIS — Z0000 Encounter for general adult medical examination without abnormal findings: Secondary | ICD-10-CM | POA: Insufficient documentation

## 2019-01-07 NOTE — Progress Notes (Signed)
HPI: Marie Grimes is a 73 y.o. female who  has a past medical history of Acid reflux, Allergy, Asthma, Environmental and seasonal allergies, History of basal cell carcinoma, History of squamous cell carcinoma, Osteopenia after menopause, Pelvic prolapse, and Positive colorectal cancer screening using Cologuard test (08/06/2018).  she presents to [facility] today, 01/07/19,  for chief complaint of: Annual physical     Patient here for annual physical / wellness exam.  See preventive care reviewed as below.    Additional concerns today include:  Will be taking a planned trip to [place] this fall, would like to know if I have any advice for traveling.     Past medical, surgical, social and family history reviewed:  Patient Active Problem List   Diagnosis Date Noted  . Positive colorectal cancer screening using Cologuard test 08/06/2018  . Osteopenia 04/16/2018  . History of hysterectomy for benign disease 04/16/2018  . Mild persistent asthma without complication [id number]  . Environmental allergies 04/16/2018  . Pelvic prolapse     Past Surgical History:  Procedure Laterality Date  . ABDOMINAL HYSTERECTOMY     age 51, endometriosis  . BREAST LUMPECTOMY Left    age 50, benign  . CERVICAL CONE BIOPSY    . COLONOSCOPY  2001   states "attempted" and had tortuous  . ELBOW SURGERY    . FRACTURE SURGERY Left 2018   wrist  . REFRACTIVE SURGERY    . SIGMOIDOSCOPY  2001  . TONSILLECTOMY      Social History   Tobacco Use  . Smoking status: Never Smoker  . Smokeless tobacco: Never Used  Substance Use Topics  . Alcohol use: Yes    Alcohol/week: 5.0 - 7.0 standard drinks    Types: 5 - 7 Standard drinks or equivalent per week    Family History  Problem Relation Age of Onset  . Hypertension Mother   . Stroke Mother   . Stroke Father   . Diabetes Brother   . Celiac disease Brother   . Rectal cancer Cousin   . Colon cancer Neg Hx   .  Esophageal cancer Neg Hx   . Stomach cancer Neg Hx   . Colon polyps Neg Hx      Current medication list and allergy/intolerance information reviewed:    Current Outpatient Medications  Medication Sig Dispense Refill  . albuterol (PROVENTIL HFA;VENTOLIN HFA) 108 (90 Base) MCG/ACT inhaler Inhale 1-2 puffs into the lungs every 4 (four) hours as needed for wheezing or shortness of breath. (Patient not taking: Reported on 11/04/2018) 2 Inhaler 99  . Calcium Carb-Cholecalciferol (CALCIUM PLUS D3 ABSORBABLE PO) Take by mouth.    . fluticasone (FLONASE) 50 MCG/ACT nasal spray Place 2 sprays into both nostrils daily. 48 g 3  . Fluticasone-Salmeterol (ADVAIR DISKUS) 100-50 MCG/DOSE AEPB Inhale 1 puff into the lungs 2 (two) times daily. 60 each 99  . levocetirizine (XYZAL) 5 MG tablet Take 1 tablet (5 mg total) by mouth daily. 90 tablet 3  . Multiple Vitamins-Minerals (WOMENS MULTIVITAMIN PO) Take by mouth.    Marland Kitchen omeprazole (PRILOSEC) 40 MG capsule Take 1 capsule (40 mg total) by mouth daily. 90 capsule 3  . OVER THE COUNTER MEDICATION Zinc 50 mg one tablet daily.    Marland Kitchen XIIDRA 5 % SOLN Place 1 drop into both eyes 2 (two) times daily.  10   No current facility-administered medications for this visit.     Allergies  Allergen Reactions  . Pneumococcal  Polysaccharide Vaccine Other (See Comments)    muscle spasms and cramping  . Other   . Pertussis Vaccines Other (See Comments)    Fever, injection site reaction      Review of Systems:  Constitutional:  No  fever, no chills, No recent illness  HEENT: No  headache, no vision change  Cardiac: No  chest pain, No  pressure, No palpitations  Respiratory:  No  shortness of breath. No  Cough  Gastrointestinal: No  abdominal pain,  +occasional constipation   Musculoskeletal: No new myalgia/arthralgia  Skin: No  Rash, No other wounds/concerning lesions  Genitourinary: No  incontinence, No  abnormal genital bleeding, No abnormal genital  discharge  Neurologic: No  weakness, No  dizziness  Psychiatric: No  concerns with depression, No  concerns with anxiety, No sleep problems, No mood problems  Exam:  BP 138/75 (BP Location: Left Arm, Patient Position: Sitting, Cuff Size: Normal)   Pulse 68   Temp 98.1 F (36.7 C) (Oral)   Wt 178 lb 4.8 oz (80.9 kg)   BMI 28.78 kg/m   Constitutional: VS see above. General Appearance: alert, well-developed, well-nourished, NAD  Eyes: Normal lids and conjunctive, non-icteric sclera  Neck: No masses, trachea midline. No thyroid enlargement. No tenderness/mass appreciated. No lymphadenopathy  Respiratory: Normal respiratory effort. no wheeze, no rhonchi, no rales  Cardiovascular: S1/S2 normal, no murmur, no rub/gallop auscultated. RRR.   Gastrointestinal: Nontender, no masses. No hepatomegaly, no splenomegaly. No hernia appreciated. Bowel sounds normal. Rectal exam deferred.   Musculoskeletal: Gait normal. No clubbing/cyanosis of digits.   Neurological: Normal balance/coordination. No tremor. No cranial nerve deficit on limited exam. Motor and sensation intact and symmetric. Cerebellar reflexes intact.   Skin: warm, dry, intact. No rash/ulcer. No concerning nevi or subq nodules on limited exam.    Psychiatric: Normal judgment/insight. Normal mood and affect. Oriented x3.       ASSESSMENT/PLAN: The primary encounter diagnosis was Annual physical exam. A diagnosis of Osteopenia, unspecified location was also pertinent to this visit.   Orders Placed This Encounter  Procedures  . CBC  . COMPLETE METABOLIC PANEL WITH GFR  . Lipid panel  . VITAMIN D 25 Hydroxy (Vit-D Deficiency, Fractures)    No orders of the defined types were placed in this encounter.   Patient Instructions   General Preventive Care  Most recent routine screening lipids/other labs: ordered today   Blood pressure goal 140/90 or less, or even better if we can get 130/80 or less  Tobacco: don't!    Alcohol: responsible moderation is ok for most adults - if you have concerns about your alcohol intake, please talk to me!   Exercise: as tolerated to reduce risk of cardiovascular disease and diabetes. Strength training will also prevent osteoporosis.   Mental health: if need for mental health care (medicines, counseling, other), or concerns about moods, please let me know!   Sexual health: if ever need for STD testing, or if concerns with libido/pain problems, please let me know!   Advanced Directive: Living Will and/or Healthcare Power of Attorney recommended for all adults, regardless of age or health.  Vaccines  Flu vaccine: recommended for almost everyone, every fall.   Shingles vaccine: Shingrix all done!  Pneumonia vaccines: all done!  Tetanus booster: Tdap due 2022 Cancer screenings   Colon cancer screening: recommended for everyone age 35-75  Breast cancer screening: mammogram recommended annually after age 31.   Cervical cancer screening: Pap can usually stop at age 28 or w/  hysterectomy.   Lung cancer screening: not needed for non-smokers  Infection screenings . HIV: recommended screening at least once age 53-65, more often as needed. . Gonorrhea/Chlamydia: screening as needed. . Hepatitis C: recommended for anyone born 32-1966 - done!  . TB: certain at-risk populations, or depending on work requirements and/or travel history Other . Bone Density Test: recommended for women at age 28 evey other year. Due 07/2020              Immunization History  Administered Date(s) Administered  . Influenza-Unspecified 03/16/2014, 03/22/2015, 03/02/2016  . Pneumococcal Conjugate-13 11/24/2012, 01/22/2017  . Pneumococcal Polysaccharide-23 09/12/2000  . Td 05/14/2011  . Tdap 05/14/2011  . Zoster 12/08/2007  . Zoster Recombinat (Shingrix) 02/11/2017            Visit summary with medication list and pertinent instructions was printed for patient to  review. All questions at time of visit were answered - patient instructed to contact office with any additional concerns or updates. ER/RTC precautions were reviewed with the patient.     Please note: voice recognition software was used to produce this document, and typos may escape review. Please contact Dr. Sheppard Coil for any needed clarifications.     Follow-up plan: Return for MEDICARE VISIT UPCOMING WITH KIM , [place].

## 2019-01-07 NOTE — Patient Instructions (Addendum)
General Preventive Care  Most recent routine screening lipids/other labs: ordered today   Blood pressure goal 140/90 or less, or even better if we can get 130/80 or less  Tobacco: don't!   Alcohol: responsible moderation is ok for most adults - if you have concerns about your alcohol intake, please talk to me!   Exercise: as tolerated to reduce risk of cardiovascular disease and diabetes. Strength training will also prevent osteoporosis.   Mental health: if need for mental health care (medicines, counseling, other), or concerns about moods, please let me know!   Sexual health: if ever need for STD testing, or if concerns with libido/pain problems, please let me know!   Advanced Directive: Living Will and/or Healthcare Power of Attorney recommended for all adults, regardless of age or health.  Vaccines  Flu vaccine: recommended for almost everyone, every fall.   Shingles vaccine: Shingrix all done!  Pneumonia vaccines: all done!  Tetanus booster: Tdap due 2022 Cancer screenings   Colon cancer screening: recommended for everyone age 52-75  Breast cancer screening: mammogram recommended annually after age 87.   Cervical cancer screening: Pap can usually stop at age 77 or w/ hysterectomy.   Lung cancer screening: not needed for non-smokers  Infection screenings . HIV: recommended screening at least once age 61-65, more often as needed. . Gonorrhea/Chlamydia: screening as needed. . Hepatitis C: recommended for anyone born 48-1966 - done!  . TB: certain at-risk populations, or depending on work requirements and/or travel history Other . Bone Density Test: recommended for women at age 74 evey other year. Due 07/2020              Immunization History  Administered Date(s) Administered  . Influenza-Unspecified 03/16/2014, 03/22/2015, 03/02/2016  . Pneumococcal Conjugate-13 11/24/2012, 01/22/2017  . Pneumococcal Polysaccharide-23 09/12/2000  . Td 05/14/2011  .  Tdap 05/14/2011  . Zoster 12/08/2007  . Zoster Recombinat (Shingrix) 02/11/2017

## 2019-01-08 LAB — COMPLETE METABOLIC PANEL WITH GFR
AG Ratio: 1.6 (calc) (ref 1.0–2.5)
ALT: 17 U/L (ref 6–29)
AST: 20 U/L (ref 10–35)
Albumin: 4.4 g/dL (ref 3.6–5.1)
Alkaline phosphatase (APISO): 57 U/L (ref 37–153)
BUN: 16 mg/dL (ref 7–25)
CO2: 27 mmol/L (ref 20–32)
Calcium: 9.1 mg/dL (ref 8.6–10.4)
Chloride: 101 mmol/L (ref 98–110)
Creat: 0.86 mg/dL (ref 0.60–0.93)
GFR, Est African American: 78 mL/min/{1.73_m2} (ref >=60)
GFR, Est Non African American: 67 mL/min/{1.73_m2} (ref >=60)
Globulin: 2.7 g/dL (calc) (ref 1.9–3.7)
Glucose, Bld: 88 mg/dL (ref 65–99)
Potassium: 4.4 mmol/L (ref 3.5–5.3)
Sodium: 137 mmol/L (ref 135–146)
Total Bilirubin: 0.4 mg/dL (ref 0.2–1.2)
Total Protein: 7.1 g/dL (ref 6.1–8.1)

## 2019-01-08 LAB — CBC
HCT: 34.3 % — ABNORMAL LOW (ref 35.0–45.0)
Hemoglobin: 11.8 g/dL (ref 11.7–15.5)
MCH: 31.2 pg (ref 27.0–33.0)
MCHC: 34.4 g/dL (ref 32.0–36.0)
MCV: 90.7 fL (ref 80.0–100.0)
MPV: 10.7 fL (ref 7.5–12.5)
Platelets: 245 10*3/uL (ref 140–400)
RBC: 3.78 10*6/uL — ABNORMAL LOW (ref 3.80–5.10)
RDW: 13.1 % (ref 11.0–15.0)
WBC: 4.4 10*3/uL (ref 3.8–10.8)

## 2019-01-08 LAB — VITAMIN D 25 HYDROXY (VIT D DEFICIENCY, FRACTURES): Vit D, 25-Hydroxy: 42 ng/mL (ref 30–100)

## 2019-01-08 LAB — LIPID PANEL
Cholesterol: 182 mg/dL (ref <200)
HDL: 72 mg/dL (ref >=50)
LDL Cholesterol (Calc): 97 mg/dL (calc)
Non-HDL Cholesterol (Calc): 110 mg/dL (calc) (ref <130)
Total CHOL/HDL Ratio: 2.5 (calc) (ref <5.0)
Triglycerides: 44 mg/dL (ref <150)

## 2019-01-10 NOTE — Progress Notes (Deleted)
Subjective:   Marie Grimes is a 73 y.o. female who presents for an Initial Medicare Annual Wellness Visit.  Review of Systems    No ROS.  Medicare Wellness Virtual Visit.  Visual/audio telehealth visit, UTA vital signs.   See social history for additional risk factors.       Sleep patterns:  Home Safety/Smoke Alarms: Feels safe in home. Smoke alarms in place.  Living environment; Seat Belt Safety/Bike Helmet: Wears seat belt.   Female:   Pap- Aged out      Mammo-  UTD     Dexa scan-  UTD     CCS- UTD     Objective:    There were no vitals filed for this visit. There is no height or weight on file to calculate BMI.  Advanced Directives 06/08/2018 04/16/2018  Does Patient Have a Medical Advance Directive? Yes Yes  Type of Paramedic of [place];Living will [place];Living will  Does patient want to make changes to medical advance directive? - No - Patient declined  Copy of [place] in Chart? - No - copy requested    Current Medications (verified) Outpatient Encounter Medications as of 01/11/2019  Medication Sig  . albuterol (PROVENTIL HFA;VENTOLIN HFA) 108 (90 Base) MCG/ACT inhaler Inhale 1-2 puffs into the lungs every 4 (four) hours as needed for wheezing or shortness of breath.  . Calcium Carb-Cholecalciferol (CALCIUM PLUS D3 ABSORBABLE PO) Take by mouth.  . fluticasone (FLONASE) 50 MCG/ACT nasal spray Place 2 sprays into both nostrils daily.  . Fluticasone-Salmeterol (ADVAIR DISKUS) 100-50 MCG/DOSE AEPB Inhale 1 puff into the lungs 2 (two) times daily.  Marland Kitchen levocetirizine (XYZAL) 5 MG tablet Take 1 tablet (5 mg total) by mouth daily.  . Multiple Vitamins-Minerals (WOMENS MULTIVITAMIN PO) Take by mouth.  Marland Kitchen omeprazole (PRILOSEC) 40 MG capsule Take 1 capsule (40 mg total) by mouth daily.  Marland Kitchen XIIDRA 5 % SOLN Place 1 drop into both eyes 2 (two) times daily.   No facility-administered encounter medications on  file as of 01/11/2019.     Allergies (verified) Pneumococcal polysaccharide vaccine, Other, and Pertussis vaccines   History: Past Medical History:  Diagnosis Date  . Acid reflux   . Allergy   . Asthma   . Environmental and seasonal allergies   . History of basal cell carcinoma   . History of squamous cell carcinoma   . Osteopenia after menopause   . Pelvic prolapse   . Positive colorectal cancer screening using Cologuard test 08/06/2018   Referred 08/06/18 to GI   Past Surgical History:  Procedure Laterality Date  . ABDOMINAL HYSTERECTOMY     age 68, endometriosis  . BREAST LUMPECTOMY Left    age 9, benign  . CERVICAL CONE BIOPSY    . COLONOSCOPY  2001   states "attempted" and had tortuous  . ELBOW SURGERY    . FRACTURE SURGERY Left 2018   wrist  . REFRACTIVE SURGERY    . SIGMOIDOSCOPY  2001  . TONSILLECTOMY     Family History  Problem Relation Age of Onset  . Hypertension Mother   . Stroke Mother   . Stroke Father   . Diabetes Brother   . Celiac disease Brother   . Rectal cancer Cousin   . Colon cancer Neg Hx   . Esophageal cancer Neg Hx   . Stomach cancer Neg Hx   . Colon polyps Neg Hx    Social History   Socioeconomic  History  . Marital status: Married    Spouse name: Not on file  . Number of children: Not on file  . Years of education: Not on file  . Highest education level: Not on file  Occupational History  . Not on file  Social Needs  . Financial resource strain: Not on file  . Food insecurity    Worry: Not on file    Inability: Not on file  . Transportation needs    Medical: Not on file    Non-medical: Not on file  Tobacco Use  . Smoking status: Never Smoker  . Smokeless tobacco: Never Used  Substance and Sexual Activity  . Alcohol use: Yes    Alcohol/week: 5.0 - 7.0 standard drinks    Types: 5 - 7 Standard drinks or equivalent per week  . Drug use: Never  . Sexual activity: Yes  Lifestyle  . Physical activity    Days per week: Not  on file    Minutes per session: Not on file  . Stress: Not on file  Relationships  . Social Herbalist on phone: Not on file    Gets together: Not on file    Attends religious service: Not on file    Active member of club or organization: Not on file    Attends meetings of clubs or organizations: Not on file    Relationship status: Not on file  Other Topics Concern  . Not on file  Social History Narrative  . Not on file    Tobacco Counseling Counseling given: Not Answered   Clinical Intake:                        Activities of Daily Living No flowsheet data found.   Immunizations and Health Maintenance Immunization History  Administered Date(s) Administered  . Influenza-Unspecified 03/16/2014, 03/22/2015, 03/02/2016  . Pneumococcal Conjugate-13 11/24/2012, 01/22/2017  . Pneumococcal Polysaccharide-23 09/12/2000  . Td 05/14/2011  . Tdap 05/14/2011  . Zoster 12/08/2007  . Zoster Recombinat (Shingrix) 02/11/2017   Health Maintenance Due  Topic Date Due  . INFLUENZA VACCINE  01/01/2019    Patient Care Team: [name], DO as PCP - General (Osteopathic Medicine) Servando Salina, MD as Consulting Physician (Obstetrics and Gynecology) Rolm Bookbinder, MD as Consulting Physician (Dermatology)  Indicate any recent Medical Services you may have received from other than [facility] providers in the past year (date may be approximate).     Assessment:   This is a routine wellness examination for [place].Physical assessment deferred to PCP.   Hearing/Vision screen No exam data present  Dietary issues and exercise activities discussed:   Diet Breakfast: Lunch:  Dinner:       Goals   None    Depression Screen PHQ 2/9 Scores 01/07/2019 09/09/2018 04/16/2018  PHQ - 2 Score 1 0 0  PHQ- 9 Score 2 - -    Fall Risk Fall Risk  08/17/2018 04/16/2018  Falls in the past year? 0 0  Number falls in past yr: 0 -  Injury with Fall? 0 -  Follow  up Falls evaluation completed -    Is the patient's home free of loose throw rugs in walkways, pet beds, electrical cords, etc?   {Blank single:19197::"yes","no"}      Grab bars in the bathroom? {Blank single:19197::"yes","no"}      Handrails on the stairs?   {Blank single:19197::"yes","no"}      Adequate lighting?   {Blank single:19197::"yes","no"}  Cognitive Function:        Screening Tests Health Maintenance  Topic Date Due  . INFLUENZA VACCINE  01/01/2019  . PNA vac Low Risk Adult (2 of 2 - PPSV23) 08/12/2019 (Originally 01/22/2018)  . MAMMOGRAM  06/08/2020  . TETANUS/TDAP  05/13/2021  . COLONOSCOPY  11/03/2021  . DEXA SCAN  Completed  . Hepatitis C Screening  Completed        Plan:   ***  I have personally reviewed and noted the following in the patient's chart:   . Medical and social history . Use of alcohol, tobacco or illicit drugs  . Current medications and supplements . Functional ability and status . Nutritional status . Physical activity . Advanced directives . List of other physicians . Hospitalizations, surgeries, and ER visits in previous 12 months . Vitals . Screenings to include cognitive, depression, and falls . Referrals and appointments  In addition, I have reviewed and discussed with patient certain preventive protocols, quality metrics, and best practice recommendations. A written personalized care plan for preventive services as well as general preventive health recommendations were provided to patient.     Joanne Chars, LPN   08/29/1914

## 2019-01-11 ENCOUNTER — Ambulatory Visit: Payer: Medicare Other

## 2019-01-11 NOTE — Progress Notes (Signed)
Subjective:   Marie Grimes is a 73 y.o. female who presents for an Initial Medicare Annual Wellness Visit.  Review of Systems    No ROS.  Medicare Wellness Virtual Visit.  Visual/audio telehealth visit, UTA vital signs.   See social history for additional risk factors.     Cardiac Risk Factors include: advanced age (>77men, >75 women) Sleep patterns: getting 7 hours of sleep a night. Wakes up 2-3 times a night to void. Wakes up and feels refreshed and ready for the day. Home Safety/Smoke Alarms: Feels safe in home. Smoke alarms in place.  Living environment; Lives with husband in 1 story home no stairs in or around the home. Shower is walk in shower with grab bars and bench in place Seat Belt Safety/Bike Helmet: Wears seat belt.   Female:   Pap- Aged out      Mammo-  UTD     Dexa scan-  UTD      CCS-  UTD     Objective:    There were no vitals filed for this visit. There is no height or weight on file to calculate BMI.  Advanced Directives 01/12/2019 06/08/2018 04/16/2018  Does Patient Have a Medical Advance Directive? Yes Yes Yes  Type of Paramedic of [place];Living will [place];Living will [place];Living will  Does patient want to make changes to medical advance directive? No - Patient declined - No - Patient declined  Copy of [place] in Chart? No - copy requested - No - copy requested    Current Medications (verified) Outpatient Encounter Medications as of 01/12/2019  Medication Sig  . albuterol (PROVENTIL HFA;VENTOLIN HFA) 108 (90 Base) MCG/ACT inhaler Inhale 1-2 puffs into the lungs every 4 (four) hours as needed for wheezing or shortness of breath.  . Calcium Carb-Cholecalciferol (CALCIUM PLUS D3 ABSORBABLE PO) Take by mouth.  . fluticasone (FLONASE) 50 MCG/ACT nasal spray Place 2 sprays into both nostrils daily.  . Fluticasone-Salmeterol (ADVAIR DISKUS) 100-50 MCG/DOSE AEPB  Inhale 1 puff into the lungs 2 (two) times daily.  Marland Kitchen levocetirizine (XYZAL) 5 MG tablet Take 1 tablet (5 mg total) by mouth daily.  . Multiple Vitamins-Minerals (WOMENS MULTIVITAMIN PO) Take by mouth.  Marland Kitchen omeprazole (PRILOSEC) 40 MG capsule Take 1 capsule (40 mg total) by mouth daily.  Marland Kitchen XIIDRA 5 % SOLN Place 1 drop into both eyes 2 (two) times daily.   No facility-administered encounter medications on file as of 01/12/2019.     Allergies (verified) Pneumococcal polysaccharide vaccine, Other, and Pertussis vaccines   History: Past Medical History:  Diagnosis Date  . Acid reflux   . Allergy   . Asthma   . Environmental and seasonal allergies   . History of basal cell carcinoma   . History of squamous cell carcinoma   . Osteopenia after menopause   . Pelvic prolapse   . Positive colorectal cancer screening using Cologuard test 08/06/2018   Referred 08/06/18 to GI   Past Surgical History:  Procedure Laterality Date  . ABDOMINAL HYSTERECTOMY     age 61, endometriosis  . BREAST LUMPECTOMY Left    age 69, benign  . CERVICAL CONE BIOPSY    . COLONOSCOPY  2001   states "attempted" and had tortuous  . ELBOW SURGERY    . FRACTURE SURGERY Left 2018   wrist  . REFRACTIVE SURGERY    . SIGMOIDOSCOPY  2001  . TONSILLECTOMY     Family  History  Problem Relation Age of Onset  . Hypertension Mother   . Stroke Mother   . Stroke Father   . Diabetes Brother   . Celiac disease Brother   . Rectal cancer Cousin   . Colon cancer Neg Hx   . Esophageal cancer Neg Hx   . Stomach cancer Neg Hx   . Colon polyps Neg Hx    Social History   Socioeconomic History  . Marital status: Married    Spouse name: [name]  . Number of children: 2  . Years of education: 73  . Highest education level: Professional school degree (e.g., MD, DDS, DVM, JD)  Occupational History  . 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 Financial resource strain: Not hard at all  . Food insecurity     Worry: Never true    Inability: Never true  . Transportation needs    Medical: No    Non-medical: No  Tobacco Use  . Smoking status: Never Smoker  . Smokeless tobacco: Never Used  Substance and Sexual Activity  . Alcohol use: Yes    Alcohol/week: 4.0 - 7.0 standard drinks    Types: 4 - 7 Glasses of wine per week  . Drug use: Never  . Sexual activity: Not Currently  Lifestyle  . Physical activity    Days per week: 7 days    Minutes per session: 20 min  . Stress: Not at all  Relationships  . Social connections    Talks on phone: More than three times a week    Gets together: Once a week    Attends religious service: Never    Active member of club or organization: No    Attends meetings of clubs or organizations: Never    Relationship status: Married  Other Topics Concern  . Not on file  Social History Narrative   Walks dogs   Does light weight lifting   Play computer games, read   Shopping   gardening    Tobacco Counseling Counseling given: Not Answered   Clinical Intake:  Pre-visit preparation completed: Yes  Pain : No/denies pain     Nutritional Risks: None Diabetes: No  How often do you need to have someone help you when you read instructions, pamphlets, or other written materials from your doctor or pharmacy?: 1 - Never What is the last grade level you completed in school?: 25  Interpreter Needed?: No  Information entered by :: Orlie Dakin, LPN   Activities of Daily Living In your present state of health, do you have any difficulty performing the following activities: 01/12/2019  Hearing? N  Vision? N  Difficulty concentrating or making decisions? N  Walking or climbing stairs? N  Dressing or bathing? N  Doing errands, shopping? N  Preparing Food and eating ? N  Using the Toilet? N  In the past six months, have you accidently leaked urine? N  Do you have problems with loss of bowel control? N  Managing your Medications? N  Managing your  Finances? N  Housekeeping or managing your Housekeeping? N  Some recent data might be hidden     Immunizations and Health Maintenance Immunization History  Administered Date(s) Administered  . Influenza-Unspecified 03/16/2014, 03/22/2015, 03/02/2016  . Pneumococcal Conjugate-13 11/24/2012, 01/22/2017  . Pneumococcal Polysaccharide-23 09/12/2000  . Td 05/14/2011  . Tdap 05/14/2011  . Zoster 12/08/2007  . Zoster Recombinat (Shingrix) 02/11/2017   Health Maintenance Due  Topic Date Due  . INFLUENZA  VACCINE  01/01/2019    Patient Care Team: [name], DO as PCP - General (Osteopathic Medicine) Servando Salina, MD as Consulting Physician (Obstetrics and Gynecology) Rolm Bookbinder, MD as Consulting Physician (Dermatology)  Indicate any recent Medical Services you may have received from other than [facility] providers in the past year (date may be approximate).     Assessment:   This is a routine wellness examination for [place].Physical assessment deferred to PCP.   Hearing/Vision screen  Hearing Screening   125Hz  250Hz  500Hz  1000Hz  2000Hz  3000Hz  4000Hz  6000Hz  8000Hz   Right ear:           Left ear:           Comments: Hearing test not done, visit done via telephone due to [place] pandemic  Vision Screening Comments: Vision test not done, visit done via telephone due to [place] pandemic  Dietary issues and exercise activities discussed: Current Exercise Habits: Home exercise routine, Type of exercise: strength training/weights;walking, Time (Minutes): 20, Frequency (Times/Week): 7, Weekly Exercise (Minutes/Week): 140, Intensity: Moderate Diet eats a healthy diet of vegetables and fruits and protein. Breakfast: greek yogurt with fruit or eggs Lunch: skip Dinner: Meat and vegetables      Goals    . Exercise 150 min/wk Moderate Activity     Patient states would like to exercise more than she is. They are opening a wellness center where she lives      Depression  Screen PHQ 2/9 Scores 01/12/2019 01/07/2019 09/09/2018 04/16/2018  PHQ - 2 Score 0 1 0 0  PHQ- 9 Score 0 2 - -    Fall Risk Fall Risk  01/12/2019 08/17/2018 04/16/2018  Falls in the past year? 0 0 0  Number falls in past yr: - 0 -  Injury with Fall? 0 0 -  Follow up Falls prevention discussed Falls evaluation completed -    Is the patient's home free of loose throw rugs in walkways, pet beds, electrical cords, etc?   yes      Grab bars in the bathroom? yes      Handrails on the stairs?   yes      Adequate lighting?   yes   Cognitive Function:     6CIT Screen 01/12/2019  What Year? 0 points  What month? 0 points  What time? 0 points  Count back from 20 0 points  Months in reverse 0 points  Repeat phrase 0 points  Total Score 0    Screening Tests Health Maintenance  Topic Date Due  . INFLUENZA VACCINE  01/01/2019  . PNA vac Low Risk Adult (2 of 2 - PPSV23) 08/12/2019 (Originally 01/22/2018)  . MAMMOGRAM  06/08/2020  . TETANUS/TDAP  05/13/2021  . COLONOSCOPY  11/03/2021  . DEXA SCAN  Completed  . Hepatitis C Screening  Completed       Plan:    Ms. Jeng , Thank you for taking time to come for your Medicare Wellness Visit. I appreciate your ongoing commitment to your health goals. Please review the following plan we discussed and let me know if I can assist you in the future.  Please schedule your next medicare wellness visit with me in 1 yr. Bring a copy of your living will and/or healthcare power of attorney to your next office visit.   These are the goals we discussed: Goals    . Exercise 150 min/wk Moderate Activity     Patient states would like to exercise more than she is. They are opening a  wellness center where she lives       This is a list of the screening recommended for you and due dates:  Health Maintenance  Topic Date Due  . Flu Shot  01/01/2019  . Pneumonia vaccines (2 of 2 - PPSV23) 08/12/2019*  . Mammogram  06/08/2020  . Tetanus Vaccine   05/13/2021  . Colon Cancer Screening  11/03/2021  . DEXA scan (bone density measurement)  Completed  .  Hepatitis C: One time screening is recommended by Center for Disease Control  (CDC) for  adults born from [birth year] through 1966.   Completed  *Topic was postponed. The date shown is not the original due date.     I have personally reviewed and noted the following in the patient's chart:   . Medical and social history . Use of alcohol, tobacco or illicit drugs  . Current medications and supplements . Functional ability and status . Nutritional status . Physical activity . Advanced directives . List of other physicians . Hospitalizations, surgeries, and ER visits in previous 12 months . Vitals . Screenings to include cognitive, depression, and falls . Referrals and appointments  In addition, I have reviewed and discussed with patient certain preventive protocols, quality metrics, and best practice recommendations. A written personalized care plan for preventive services as well as general preventive health recommendations were provided to patient.     Joanne Chars, LPN   06/22/9756

## 2019-01-12 ENCOUNTER — Ambulatory Visit (INDEPENDENT_AMBULATORY_CARE_PROVIDER_SITE_OTHER): Payer: Medicare Other | Admitting: *Deleted

## 2019-01-12 ENCOUNTER — Ambulatory Visit: Payer: Medicare Other

## 2019-01-12 VITALS — BP 138/78 | Ht 66.0 in | Wt 175.0 lb

## 2019-01-12 DIAGNOSIS — Z0000 Encounter for general adult medical examination without abnormal findings: Secondary | ICD-10-CM | POA: Diagnosis not present

## 2019-01-12 NOTE — Patient Instructions (Addendum)
Marie Grimes , Thank you for taking time to come for your Medicare Wellness Visit. I appreciate your ongoing commitment to your health goals. Please review the following plan we discussed and let me know if I can assist you in the future.  Please schedule your next medicare wellness visit with me in 1 yr. Bring a copy of your living will and/or healthcare power of attorney to your next office visit.  These are the goals we discussed: Goals    . Exercise 150 min/wk Moderate Activity     Patient states would like to exercise more than she is. They are opening a wellness center where she lives       Health Maintenance After Age 61 After age 40, you are at a higher risk for certain long-term diseases and infections as well as injuries from falls. Falls are a major cause of broken bones and head injuries in people who are older than age 34. Getting regular preventive care can help to keep you healthy and well. Preventive care includes getting regular testing and making lifestyle changes as recommended by your health care provider. Talk with your health care provider about:  Which screenings and tests you should have. A screening is a test that checks for a disease when you have no symptoms.  A diet and exercise plan that is right for you. What should I know about screenings and tests to prevent falls? Screening and testing are the best ways to find a health problem early. Early diagnosis and treatment give you the best chance of managing medical conditions that are common after age 82. Certain conditions and lifestyle choices may make you more likely to have a fall. Your health care provider may recommend:  Regular vision checks. Poor vision and conditions such as cataracts can make you more likely to have a fall. If you wear glasses, make sure to get your prescription updated if your vision changes.  Medicine review. Work with your health care provider to regularly review all of the medicines you  are taking, including over-the-counter medicines. Ask your health care provider about any side effects that may make you more likely to have a fall. Tell your health care provider if any medicines that you take make you feel dizzy or sleepy.  Osteoporosis screening. Osteoporosis is a condition that causes the bones to get weaker. This can make the bones weak and cause them to break more easily.  Blood pressure screening. Blood pressure changes and medicines to control blood pressure can make you feel dizzy.  Strength and balance checks. Your health care provider may recommend certain tests to check your strength and balance while standing, walking, or changing positions.  Foot health exam. Foot pain and numbness, as well as not wearing proper footwear, can make you more likely to have a fall.  Depression screening. You may be more likely to have a fall if you have a fear of falling, feel emotionally low, or feel unable to do activities that you used to do.  Alcohol use screening. Using too much alcohol can affect your balance and may make you more likely to have a fall. What actions can I take to lower my risk of falls? General instructions  Talk with your health care provider about your risks for falling. Tell your health care provider if: ? You fall. Be sure to tell your health care provider about all falls, even ones that seem minor. ? You feel dizzy, sleepy, or off-balance.  Take over-the-counter  and prescription medicines only as told by your health care provider. These include any supplements.  Eat a healthy diet and maintain a healthy weight. A healthy diet includes low-fat dairy products, low-fat (lean) meats, and fiber from whole grains, beans, and lots of fruits and vegetables. Home safety  Remove any tripping hazards, such as rugs, cords, and clutter.  Install safety equipment such as grab bars in bathrooms and safety rails on stairs.  Keep rooms and walkways  well-lit. Activity   Follow a regular exercise program to stay fit. This will help you maintain your balance. Ask your health care provider what types of exercise are appropriate for you.  If you need a cane or walker, use it as recommended by your health care provider.  Wear supportive shoes that have nonskid soles. Lifestyle  Do not drink alcohol if your health care provider tells you not to drink.  If you drink alcohol, limit how much you have: ? 0-1 drink a day for women. ? 0-2 drinks a day for men.  Be aware of how much alcohol is in your drink. In the [place] one drink equals one typical bottle of beer (12 oz), one-half glass of wine (5 oz), or one shot of hard liquor (1 oz).  Do not use any products that contain nicotine or tobacco, such as cigarettes and e-cigarettes. If you need help quitting, ask your health care provider. Summary  Having a healthy lifestyle and getting preventive care can help to protect your health and wellness after age 11.  Screening and testing are the best way to find a health problem early and help you avoid having a fall. Early diagnosis and treatment give you the best chance for managing medical conditions that are more common for people who are older than age 61.  Falls are a major cause of broken bones and head injuries in people who are older than age 14. Take precautions to prevent a fall at home.  Work with your health care provider to learn what changes you can make to improve your health and wellness and to prevent falls. This information is not intended to replace advice given to you by your health care provider. Make sure you discuss any questions you have with your health care provider. Document Released: 04/01/2017 Document Revised: 09/09/2018 Document Reviewed: 04/01/2017 Elsevier Patient Education  2020 [employer].

## 2019-03-23 ENCOUNTER — Ambulatory Visit: Payer: Medicare Other

## 2019-03-28 ENCOUNTER — Encounter: Payer: Self-pay | Admitting: Family Medicine

## 2019-03-28 ENCOUNTER — Ambulatory Visit (INDEPENDENT_AMBULATORY_CARE_PROVIDER_SITE_OTHER): Payer: Medicare Other | Admitting: Family Medicine

## 2019-03-28 ENCOUNTER — Other Ambulatory Visit: Payer: Self-pay

## 2019-03-28 VITALS — BP 150/72 | HR 80 | Temp 98.0°F | Wt 181.0 lb

## 2019-03-28 DIAGNOSIS — M65341 Trigger finger, right ring finger: Secondary | ICD-10-CM | POA: Diagnosis not present

## 2019-03-28 MED ORDER — DICLOFENAC SODIUM 1 % TD GEL: 4.0000 g | Freq: Four times a day (QID) | TRANSDERMAL | 11 refills | Status: DC

## 2019-03-28 NOTE — Progress Notes (Signed)
Marie Grimes is a 73 y.o. female who presents to [place] today for right fourth digit triggering and pain.  Present for about 4 months.  Patient notes some swelling and some stiffness at her PIP as well as triggering and pain at her MCP.  She notes is been worsening recently.  She denies any injury.  She has not tried much treatment yet.  No fevers chills nausea vomiting or diarrhea.    ROS:  As above  Exam:  BP (!) 150/72   Pulse 80   Temp 98 F (36.7 C) (Oral)   Wt 181 lb (82.1 kg)   BMI 29.21 kg/m  Wt Readings from Last 5 Encounters:  03/28/19 181 lb (82.1 kg)  01/12/19 175 lb (79.4 kg)  01/07/19 178 lb 4.8 oz (80.9 kg)  10/26/18 174 lb (78.9 kg)  09/17/18 174 lb (78.9 kg)   General: Well Developed, well nourished, and in no acute distress.  Neuro/Psych: Alert and oriented x3, extra-ocular muscles intact, able to move all 4 extremities, sensation grossly intact. Skin: Warm and dry, no rashes noted.  Respiratory: Not using accessory muscles, speaking in full sentences, trachea midline.  Cardiovascular: Pulses palpable, no extremity edema. Abdomen: Does not appear distended. MSK:  Right hand no significant deformity. Normal motion nontender. Mild triggering present with flexion and MCP.  Normal strength.  Pulses cap refill sensation intact.    Lab and Radiology Results Procedure: Real-time Ultrasound Guided Injection of right fourth MCP A1 pulley Device: GE Logiq E   Images permanently stored and available for review in the ultrasound unit. Verbal informed consent obtained.  Discussed risks and benefits of procedure. Warned about infection bleeding damage to structures skin hypopigmentation and fat atrophy among others. Patient expresses understanding and agreement Time-out conducted.   Noted no overlying erythema, induration, or other signs of local infection.   Skin prepped in a sterile fashion.   Local  anesthesia: Topical Ethyl chloride.   With sterile technique and under real time ultrasound guidance:  40 mg of Depo-Medrol and 0.5 milliliter of lidocaine.  Total volume 1 mL injected easily.   Completed without difficulty   Pain immediately resolved suggesting accurate placement of the medication.   Advised to call if fevers/chills, erythema, induration, drainage, or persistent bleeding.   Images permanently stored and available for review in the ultrasound unit.  Impression: Technically successful ultrasound guided injection.     Assessment and Plan: 73 y.o. female with right hand trigger finger.  Plan to treat with injection as above along with double Band-Aid splint and diclofenac gel.  If not improving would consider hand PT and then referral to hand surgery if needed.  Recheck as needed.   PDMP not reviewed this encounter. No orders of the defined types were placed in this encounter.  Meds ordered this encounter  Medications  . diclofenac sodium (VOLTAREN) 1 % GEL    Sig: Apply 4 g topically 4 (four) times daily. To affected joint.    Dispense:  100 g    Refill:  11    Historical information moved to improve visibility of documentation.  Past Medical History:  Diagnosis Date  . Acid reflux   . Allergy   . Asthma   . Environmental and seasonal allergies   . History of basal cell carcinoma   . History of squamous cell carcinoma   . Osteopenia after menopause   . Pelvic prolapse   . Positive colorectal cancer screening using  Cologuard test 08/06/2018   Referred 08/06/18 to GI   Past Surgical History:  Procedure Laterality Date  . ABDOMINAL HYSTERECTOMY     age 34, endometriosis  . BREAST LUMPECTOMY Left    age 26, benign  . CERVICAL CONE BIOPSY    . COLONOSCOPY  2001   states "attempted" and had tortuous  . ELBOW SURGERY    . FRACTURE SURGERY Left 2018   wrist  . REFRACTIVE SURGERY    . SIGMOIDOSCOPY  2001  . TONSILLECTOMY     Social History   Tobacco Use   . Smoking status: Never Smoker  . Smokeless tobacco: Never Used  Substance Use Topics  . Alcohol use: Yes    Alcohol/week: 4.0 - 7.0 standard drinks    Types: 4 - 7 Glasses of wine per week   family history includes Celiac disease in her brother; Diabetes in her brother; Hypertension in her mother; Rectal cancer in her cousin; Stroke in her father and mother.  Medications: Current Outpatient Medications  Medication Sig Dispense Refill  . albuterol (PROVENTIL HFA;VENTOLIN HFA) 108 (90 Base) MCG/ACT inhaler Inhale 1-2 puffs into the lungs every 4 (four) hours as needed for wheezing or shortness of breath. 2 Inhaler 99  . Calcium Carb-Cholecalciferol (CALCIUM PLUS D3 ABSORBABLE PO) Take by mouth.    . diclofenac sodium (VOLTAREN) 1 % GEL Apply 4 g topically 4 (four) times daily. To affected joint. 100 g 11  . fluticasone (FLONASE) 50 MCG/ACT nasal spray Place 2 sprays into both nostrils daily. 48 g 3  . Fluticasone-Salmeterol (ADVAIR DISKUS) 100-50 MCG/DOSE AEPB Inhale 1 puff into the lungs 2 (two) times daily. 60 each 99  . levocetirizine (XYZAL) 5 MG tablet Take 1 tablet (5 mg total) by mouth daily. 90 tablet 3  . Multiple Vitamins-Minerals (WOMENS MULTIVITAMIN PO) Take by mouth.    Marland Kitchen omeprazole (PRILOSEC) 40 MG capsule Take 1 capsule (40 mg total) by mouth daily. 90 capsule 3  . XIIDRA 5 % SOLN Place 1 drop into both eyes 2 (two) times daily.  10   No current facility-administered medications for this visit.    Allergies  Allergen Reactions  . Pneumococcal Polysaccharide Vaccine Other (See Comments)    muscle spasms and cramping  . Other   . Pertussis Vaccines Other (See Comments)    Fever, injection site reaction      Discussed warning signs or symptoms. Please see discharge instructions. Patient expresses understanding.

## 2019-03-28 NOTE — Patient Instructions (Signed)
Thank you for coming in today. Use double bandaid splint on the finger.  Use the diclofenac gel up to 4x daily for pain and swelling.  Hand therapy may help if needed.  If not ever better hand surgery helps.   Call or go to the ER if you develop a large red swollen joint with extreme pain or oozing puss.    Trigger Finger  Trigger finger (stenosing tenosynovitis) is a condition that causes a finger to get stuck in a bent position. Each finger has a tough, cord-like tissue that connects muscle to bone (tendon), and each tendon is surrounded by a tunnel of tissue (tendon sheath). To move your finger, your tendon needs to slide freely through the sheath. Trigger finger happens when the tendon or the sheath thickens, making it difficult to move your finger. Trigger finger can affect any finger or a thumb. It may affect more than one finger. Mild cases may clear up with rest and medicine. Severe cases require more treatment. What are the causes? Trigger finger is caused by a thickened finger tendon or tendon sheath. The cause of this thickening is not known. What increases the risk? The following factors may make you more likely to develop this condition:  Doing activities that require a strong grip.  Having rheumatoid arthritis, gout, or diabetes.  Being 13-81 years old.  Being a woman. What are the signs or symptoms? Symptoms of this condition include:  Pain when bending or straightening your finger.  Tenderness or swelling where your finger attaches to the palm of your hand.  A lump in the palm of your hand or on the inside of your finger.  Hearing a popping sound when you try to straighten your finger.  Feeling a popping, catching, or locking sensation when you try to straighten your finger.  Being unable to straighten your finger. How is this diagnosed? This condition is diagnosed based on your symptoms and a physical exam. How is this treated? This condition may be treated  by:  Resting your finger and avoiding activities that make symptoms worse.  Wearing a finger splint to keep your finger in a slightly bent position.  Taking NSAIDs to relieve pain and swelling.  Injecting medicine (steroids) into the tendon sheath to reduce swelling and irritation. Injections may need to be repeated.  Having surgery to open the tendon sheath. This may be done if other treatments do not work and you cannot straighten your finger. You may need physical therapy after surgery. Follow these instructions at home:   Use moist heat to help reduce pain and swelling as told by your health care provider.  Rest your finger and avoid activities that make pain worse. Return to normal activities as told by your health care provider.  If you have a splint, wear it as told by your health care provider.  Take over-the-counter and prescription medicines only as told by your health care provider.  Keep all follow-up visits as told by your health care provider. This is important. Contact a health care provider if:  Your symptoms are not improving with home care. Summary  Trigger finger (stenosing tenosynovitis) causes your finger to get stuck in a bent position, and it can make it difficult and painful to straighten your finger.  This condition develops when a finger tendon or tendon sheath thickens.  Treatment starts with resting, wearing a splint, and taking NSAIDs.  In severe cases, surgery to open the tendon sheath may be needed. This information is  not intended to replace advice given to you by your health care provider. Make sure you discuss any questions you have with your health care provider. Document Released: 03/08/2004 Document Revised: 05/01/2017 Document Reviewed: 04/29/2016 Elsevier Patient Education  2020 [employer].

## 2019-06-17 DIAGNOSIS — Z1231 Encounter for screening mammogram for malignant neoplasm of breast: Secondary | ICD-10-CM | POA: Diagnosis not present

## 2019-06-17 LAB — HM MAMMOGRAPHY

## 2019-06-27 DIAGNOSIS — N816 Rectocele: Secondary | ICD-10-CM | POA: Diagnosis not present

## 2019-06-27 DIAGNOSIS — N8111 Cystocele, midline: Secondary | ICD-10-CM | POA: Diagnosis not present

## 2019-06-28 DIAGNOSIS — H04123 Dry eye syndrome of bilateral lacrimal glands: Secondary | ICD-10-CM | POA: Diagnosis not present

## 2019-06-28 DIAGNOSIS — H25813 Combined forms of age-related cataract, bilateral: Secondary | ICD-10-CM | POA: Diagnosis not present

## 2019-08-10 ENCOUNTER — Other Ambulatory Visit: Payer: Self-pay

## 2019-08-10 ENCOUNTER — Ambulatory Visit (INDEPENDENT_AMBULATORY_CARE_PROVIDER_SITE_OTHER): Payer: Medicare PPO | Admitting: Sports Medicine

## 2019-08-10 DIAGNOSIS — M65341 Trigger finger, right ring finger: Secondary | ICD-10-CM | POA: Diagnosis not present

## 2019-08-10 NOTE — Assessment & Plan Note (Signed)
Marie Grimes returns, she is a pleasant 74 year old female with right fourth trigger finger. Last injected in October of last year. Having recurrence of triggering, she does have a nodule somewhat more distal underlying the proximal phalanx. I injected her flexor tendon sheath today, she may discontinue splinting and start home rehab exercises. Return to see me if no better in a month.

## 2019-08-10 NOTE — Progress Notes (Signed)
    Procedures performed today:    Procedure: Real-time Ultrasound Guided injection of the right fourth flexor tendon sheath at the level of the proximal phalanx Device: Samsung HS60  Verbal informed consent obtained.  Time-out conducted.  Noted no overlying erythema, induration, or other signs of local infection.  Skin prepped in a sterile fashion.  Local anesthesia: Topical Ethyl chloride.  With sterile technique and under real time ultrasound guidance:  1/2 cc Kenalog 40, 1/2 cc lidocaine injected easily completed without difficulty  Pain immediately resolved suggesting accurate placement of the medication.  Advised to call if fevers/chills, erythema, induration, drainage, or persistent bleeding.  Images permanently stored and available for review in the ultrasound unit.  Impression: Technically successful ultrasound guided injection.  Independent interpretation of notes and tests performed by another provider:   None.  Impression and Recommendations:    Trigger finger, right ring finger Marie Grimes returns, she is a pleasant 74 year old female with right fourth trigger finger. Last injected in October of last year. Having recurrence of triggering, she does have a nodule somewhat more distal underlying the proximal phalanx. I injected her flexor tendon sheath today, she may discontinue splinting and start home rehab exercises. Return to see me if no better in a month.    ___________________________________________ Gwen Her. Dianah Field, M.D., ABFM., CAQSM. Primary Care and [place] Instructor of [employer] [facility] [employer]

## 2019-08-11 ENCOUNTER — Encounter: Payer: Self-pay | Admitting: Osteopathic Medicine

## 2019-08-17 DIAGNOSIS — Z85828 Personal history of other malignant neoplasm of skin: Secondary | ICD-10-CM | POA: Diagnosis not present

## 2019-08-17 DIAGNOSIS — L814 Other melanin hyperpigmentation: Secondary | ICD-10-CM | POA: Diagnosis not present

## 2019-08-17 DIAGNOSIS — L821 Other seborrheic keratosis: Secondary | ICD-10-CM | POA: Diagnosis not present

## 2019-08-17 DIAGNOSIS — L570 Actinic keratosis: Secondary | ICD-10-CM | POA: Diagnosis not present

## 2019-08-28 ENCOUNTER — Other Ambulatory Visit: Payer: Self-pay | Admitting: Osteopathic Medicine

## 2019-09-01 ENCOUNTER — Other Ambulatory Visit: Payer: Self-pay

## 2019-09-01 MED ORDER — FLUTICASONE-SALMETEROL 100-50 MCG/DOSE IN AEPB: 1.0000 | INHALATION_SPRAY | Freq: Two times a day (BID) | RESPIRATORY_TRACT | 11 refills | Status: DC

## 2019-09-26 DIAGNOSIS — N816 Rectocele: Secondary | ICD-10-CM | POA: Diagnosis not present

## 2019-09-26 DIAGNOSIS — N898 Other specified noninflammatory disorders of vagina: Secondary | ICD-10-CM | POA: Diagnosis not present

## 2019-09-26 DIAGNOSIS — N8111 Cystocele, midline: Secondary | ICD-10-CM | POA: Diagnosis not present

## 2019-10-25 ENCOUNTER — Other Ambulatory Visit: Payer: Self-pay | Admitting: Osteopathic Medicine

## 2019-11-30 DIAGNOSIS — N816 Rectocele: Secondary | ICD-10-CM | POA: Diagnosis not present

## 2019-11-30 DIAGNOSIS — N8111 Cystocele, midline: Secondary | ICD-10-CM | POA: Diagnosis not present

## 2019-11-30 DIAGNOSIS — N898 Other specified noninflammatory disorders of vagina: Secondary | ICD-10-CM | POA: Diagnosis not present

## 2019-12-14 DIAGNOSIS — N8111 Cystocele, midline: Secondary | ICD-10-CM | POA: Diagnosis not present

## 2019-12-14 DIAGNOSIS — N816 Rectocele: Secondary | ICD-10-CM | POA: Diagnosis not present

## 2019-12-16 ENCOUNTER — Telehealth: Payer: Self-pay | Admitting: Osteopathic Medicine

## 2019-12-16 NOTE — Telephone Encounter (Signed)
Has physical and called to get orders placed to do them when she has time in the morning. She called early because she'll be traveling and wants to make sure she does them at some point before visit.

## 2019-12-19 ENCOUNTER — Other Ambulatory Visit: Payer: Self-pay | Admitting: Nurse Practitioner

## 2019-12-19 DIAGNOSIS — Z0000 Encounter for general adult medical examination without abnormal findings: Secondary | ICD-10-CM

## 2019-12-28 ENCOUNTER — Other Ambulatory Visit: Payer: Self-pay | Admitting: Osteopathic Medicine

## 2019-12-28 MED ORDER — FLUTICASONE PROPIONATE 50 MCG/ACT NA SUSP: 1.0000 | Freq: Every day | NASAL | 0 refills | Status: DC

## 2020-01-06 ENCOUNTER — Encounter: Payer: Medicare PPO | Admitting: Osteopathic Medicine

## 2020-01-17 ENCOUNTER — Encounter: Payer: Self-pay | Admitting: Osteopathic Medicine

## 2020-01-26 DIAGNOSIS — E7800 Pure hypercholesterolemia, unspecified: Secondary | ICD-10-CM | POA: Diagnosis not present

## 2020-01-26 DIAGNOSIS — Z0000 Encounter for general adult medical examination without abnormal findings: Secondary | ICD-10-CM | POA: Diagnosis not present

## 2020-01-26 LAB — COMPLETE METABOLIC PANEL WITH GFR
AG Ratio: 1.7 (calc) (ref 1.0–2.5)
ALT: 19 U/L (ref 6–29)
AST: 24 U/L (ref 10–35)
Albumin: 4.4 g/dL (ref 3.6–5.1)
Alkaline phosphatase (APISO): 60 U/L (ref 37–153)
BUN: 14 mg/dL (ref 7–25)
CO2: 28 mmol/L (ref 20–32)
Calcium: 9.1 mg/dL (ref 8.6–10.4)
Chloride: 100 mmol/L (ref 98–110)
Creat: 0.88 mg/dL (ref 0.60–0.93)
GFR, Est African American: 74 mL/min/{1.73_m2} (ref >=60)
GFR, Est Non African American: 64 mL/min/{1.73_m2} (ref >=60)
Globulin: 2.6 g/dL (calc) (ref 1.9–3.7)
Glucose, Bld: 81 mg/dL (ref 65–99)
Potassium: 4.3 mmol/L (ref 3.5–5.3)
Sodium: 135 mmol/L (ref 135–146)
Total Bilirubin: 0.5 mg/dL (ref 0.2–1.2)
Total Protein: 7 g/dL (ref 6.1–8.1)

## 2020-01-26 LAB — LIPID PANEL
Cholesterol: 190 mg/dL (ref <200)
HDL: 68 mg/dL (ref >=50)
LDL Cholesterol (Calc): 108 mg/dL (calc) — ABNORMAL HIGH
Non-HDL Cholesterol (Calc): 122 mg/dL (calc) (ref <130)
Total CHOL/HDL Ratio: 2.8 (calc) (ref <5.0)
Triglycerides: 59 mg/dL (ref <150)

## 2020-01-26 LAB — CBC WITH DIFFERENTIAL/PLATELET
Absolute Monocytes: 545 cells/uL (ref 200–950)
Basophils Absolute: 50 cells/uL (ref 0–200)
Basophils Relative: 1.1 %
Eosinophils Absolute: 122 cells/uL (ref 15–500)
Eosinophils Relative: 2.7 %
HCT: 33.6 % — ABNORMAL LOW (ref 35.0–45.0)
Hemoglobin: 11.6 g/dL — ABNORMAL LOW (ref 11.7–15.5)
Lymphs Abs: 1269 cells/uL (ref 850–3900)
MCH: 32 pg (ref 27.0–33.0)
MCHC: 34.5 g/dL (ref 32.0–36.0)
MCV: 92.8 fL (ref 80.0–100.0)
MPV: 10.6 fL (ref 7.5–12.5)
Monocytes Relative: 12.1 %
Neutro Abs: 2516 cells/uL (ref 1500–7800)
Neutrophils Relative %: 55.9 %
Platelets: 251 10*3/uL (ref 140–400)
RBC: 3.62 10*6/uL — ABNORMAL LOW (ref 3.80–5.10)
RDW: 13 % (ref 11.0–15.0)
Total Lymphocyte: 28.2 %
WBC: 4.5 10*3/uL (ref 3.8–10.8)

## 2020-01-30 ENCOUNTER — Encounter: Payer: Self-pay | Admitting: Osteopathic Medicine

## 2020-01-30 ENCOUNTER — Ambulatory Visit (INDEPENDENT_AMBULATORY_CARE_PROVIDER_SITE_OTHER): Payer: Medicare PPO | Admitting: Osteopathic Medicine

## 2020-01-30 VITALS — BP 145/89 | HR 70 | Ht 66.0 in | Wt 175.0 lb

## 2020-01-30 DIAGNOSIS — Z0000 Encounter for general adult medical examination without abnormal findings: Secondary | ICD-10-CM | POA: Diagnosis not present

## 2020-01-30 MED ORDER — OMEPRAZOLE 40 MG PO CPDR: 40.0000 mg | DELAYED_RELEASE_CAPSULE | Freq: Every day | ORAL | 3 refills | Status: AC

## 2020-01-30 MED ORDER — DICLOFENAC SODIUM 1 % EX GEL: 4.0000 g | Freq: Four times a day (QID) | CUTANEOUS | 11 refills | Status: AC

## 2020-01-30 MED ORDER — LEVOCETIRIZINE DIHYDROCHLORIDE 5 MG PO TABS: 5.0000 mg | ORAL_TABLET | Freq: Every day | ORAL | 3 refills | Status: AC

## 2020-01-30 MED ORDER — ALBUTEROL SULFATE HFA 108 (90 BASE) MCG/ACT IN AERS: 1.0000 | INHALATION_SPRAY | RESPIRATORY_TRACT | 99 refills | Status: AC | PRN

## 2020-01-30 NOTE — Progress Notes (Signed)
HPI: Marie Grimes is a 74 y.o. female who  has a past medical history of Acid reflux, Allergy, Asthma, Environmental and seasonal allergies, History of basal cell carcinoma, History of squamous cell carcinoma, Osteopenia after menopause, Pelvic prolapse, and Positive colorectal cancer screening using Cologuard test (08/06/2018).  she presents to [facility] today, 01/30/20,  for chief complaint of: Annual physical     Patient here for annual physical / wellness exam.  See preventive care reviewed as below.    Additional concerns today include: none     Past medical, surgical, social and family history reviewed:  Patient Active Problem List   Diagnosis Date Noted   Trigger finger, right ring finger 08/10/2019   Positive colorectal cancer screening using Cologuard test 08/06/2018   Osteopenia 04/16/2018   History of hysterectomy for benign disease 04/16/2018   Mild persistent asthma without complication 60/63/0160   Environmental allergies 04/16/2018   Pelvic prolapse     Past Surgical History:  Procedure Laterality Date   ABDOMINAL HYSTERECTOMY     age 36, endometriosis   BREAST LUMPECTOMY Left    age 26, benign   CERVICAL CONE BIOPSY     COLONOSCOPY  2001   states "attempted" and had tortuous   ELBOW SURGERY     FRACTURE SURGERY Left 2018   wrist   REFRACTIVE SURGERY     SIGMOIDOSCOPY  2001   TONSILLECTOMY      Social History   Tobacco Use   Smoking status: Never Smoker   Smokeless tobacco: Never Used  Substance Use Topics   Alcohol use: Yes    Alcohol/week: 4.0 - 7.0 standard drinks    Types: 4 - 7 Glasses of wine per week    Family History  Problem Relation Age of Onset   Hypertension Mother    Stroke Mother    Stroke Father    Diabetes Brother    Celiac disease Brother    Rectal cancer Cousin    Colon cancer Neg Hx    Esophageal cancer Neg Hx    Stomach cancer Neg Hx    Colon  polyps Neg Hx      Current medication list and allergy/intolerance information reviewed:    Current Outpatient Medications  Medication Sig Dispense Refill   albuterol (VENTOLIN HFA) 108 (90 Base) MCG/ACT inhaler Inhale 1-2 puffs into the lungs every 4 (four) hours as needed for wheezing or shortness of breath. 18 g 99   Calcium Carb-Cholecalciferol (CALCIUM PLUS D3 ABSORBABLE PO) Take by mouth.     fluticasone (FLONASE) 50 MCG/ACT nasal spray Place 1 spray into both nostrils daily. 48 mL 0   Fluticasone-Salmeterol (ADVAIR DISKUS) 100-50 MCG/DOSE AEPB Inhale 1 puff into the lungs 2 (two) times daily. 60 each 11   levocetirizine (XYZAL) 5 MG tablet Take 1 tablet (5 mg total) by mouth daily. 90 tablet 3   Multiple Vitamins-Minerals (WOMENS MULTIVITAMIN PO) Take by mouth.     omeprazole (PRILOSEC) 40 MG capsule Take 1 capsule (40 mg total) by mouth daily. 90 capsule 3   XIIDRA 5 % SOLN Place 1 drop into both eyes 2 (two) times daily.  10   diclofenac Sodium (VOLTAREN) 1 % GEL Apply 4 g topically 4 (four) times daily. To affected joint. 300 g 11   No current facility-administered medications for this visit.    Allergies  Allergen Reactions   Pneumococcal Polysaccharide Vaccine Other (See Comments)    muscle spasms and cramping  Other    Pertussis Vaccines Other (See Comments)    Fever, injection site reaction     Immunization History  Administered Date(s) Administered   Influenza, High Dose Seasonal PF 03/14/2019   Influenza-Unspecified 03/16/2014, 03/22/2015, 03/02/2016   Moderna SARS-COVID-2 Vaccination 06/14/2019, 07/12/2019   Pneumococcal Conjugate-13 11/24/2012, 01/22/2017   Pneumococcal Polysaccharide-23 09/12/2000   Td 05/14/2011   Tdap 05/14/2011   Zoster 12/08/2007   Zoster Recombinat (Shingrix) 02/11/2017     Review of Systems:  Constitutional:  No  fever, no chills, No recent illness  HEENT: No  headache, no vision change  Cardiac: No   chest pain, No  pressure, No palpitations  Respiratory:  No  shortness of breath. No  Cough  Gastrointestinal: No  abdominal pain,  +occasional constipation   Musculoskeletal: No new myalgia/arthralgia  Skin: No  Rash, No other wounds/concerning lesions  Genitourinary: No  incontinence, No  abnormal genital bleeding, No abnormal genital discharge  Neurologic: No  weakness, No  dizziness  Psychiatric: No  concerns with depression, No  concerns with anxiety, No sleep problems, No mood problems  Exam:  BP (!) 145/89 (BP Location: Right Arm, Patient Position: Sitting)    Pulse 70    Ht 5\' 6"  (1.676 m)    Wt 175 lb (79.4 kg)    SpO2 100%    BMI 28.25 kg/m   Constitutional: VS see above. General Appearance: alert, well-developed, well-nourished, NAD  Eyes: Normal lids and conjunctive, non-icteric sclera  Neck: No masses, trachea midline. No thyroid enlargement. No tenderness/mass appreciated. No lymphadenopathy  Respiratory: Normal respiratory effort. no wheeze, no rhonchi, no rales  Cardiovascular: S1/S2 normal, no murmur, no rub/gallop auscultated. RRR.   Gastrointestinal: Nontender, no masses. No hepatomegaly, no splenomegaly. No hernia appreciated. Bowel sounds normal. Rectal exam deferred.   Musculoskeletal: Gait normal. No clubbing/cyanosis of digits.   Neurological: Normal balance/coordination. No tremor. No cranial nerve deficit on limited exam. Motor and sensation intact and symmetric. Cerebellar reflexes intact.   Skin: warm, dry, intact. No rash/ulcer. No concerning nevi or subq nodules on limited exam.    Psychiatric: Normal judgment/insight. Normal mood and affect. Oriented x3.       ASSESSMENT/PLAN: The encounter diagnosis was Annual physical exam.    Patient Instructions  General Preventive Care  Most recent routine screening labs: see attached.   Blood pressure goal 130/80 or less.   Tobacco: don't!   Alcohol: responsible moderation is ok for most  adults - if you have concerns about your alcohol intake, please talk to me!   Exercise: as tolerated to reduce risk of cardiovascular disease and diabetes. Strength training will also prevent osteoporosis.   Mental health: if need for mental health care (medicines, counseling, other), or concerns about moods, please let me know!   Sexual / Reproductive health: if need for STD testing, or if concerns with libido/pain problems, please let me know!  Advanced Directive: Living Will and/or Healthcare Power of Attorney recommended for all adults, regardless of age or health.  Vaccines  Flu vaccine: for almost everyone, every fall.   Shingles vaccine: done, I don't see second Shingrix vaccine on file - please check w/ your pharmacy.   Pneumonia vaccines: done.  Tetanus booster: every 10 years due 05/2021  COVID vaccine: THANKS for getting your vaccine! :) Plan for booster 8 months after your second shot (due 03/10/2020) Cancer screenings   Colon cancer screening: for everyone age 85-75.   Breast cancer screening: mammogram annually after age 68-75, optional  after age 47   Cervical cancer screening: Pap every 1 to 5 years depending on age and other risk factors. Can usually stop at age 36 or w/ hysterectomy.   Lung cancer screening: not needed for non-smokers  Infection screenings   HIV: recommended screening at least once age 25-65, more often as needed.  Gonorrhea/Chlamydia: screening as needed  Hepatitis C: recommended once for everyone age 34-91  TB: certain at-risk populations, or depending on work requirements and/or travel history Other  Bone Density Test: due next year        Visit summary with medication list and pertinent instructions was printed for patient to review. All questions at time of visit were answered - patient instructed to contact office with any additional concerns or updates. ER/RTC precautions were reviewed with the patient.     Please note: voice  recognition software was used to produce this document, and typos may escape review. Please contact Dr. Sheppard Coil for any needed clarifications.     Follow-up plan: Return in about 1 year (around 01/29/2021) for ANNUAL (call week prior to visit for lab orders) - SEE [place] SOONER IF NEEDED .

## 2020-01-30 NOTE — Patient Instructions (Signed)
General Preventive Care  Most recent routine screening labs: see attached.   Blood pressure goal 130/80 or less.   Tobacco: don't!   Alcohol: responsible moderation is ok for most adults - if you have concerns about your alcohol intake, please talk to me!   Exercise: as tolerated to reduce risk of cardiovascular disease and diabetes. Strength training will also prevent osteoporosis.   Mental health: if need for mental health care (medicines, counseling, other), or concerns about moods, please let me know!   Sexual / Reproductive health: if need for STD testing, or if concerns with libido/pain problems, please let me know!  Advanced Directive: Living Will and/or Healthcare Power of Attorney recommended for all adults, regardless of age or health.  Vaccines  Flu vaccine: for almost everyone, every fall.   Shingles vaccine: done, I don't see second Shingrix vaccine on file - please check w/ your pharmacy.   Pneumonia vaccines: done.  Tetanus booster: every 10 years due 05/2021  COVID vaccine: THANKS for getting your vaccine! :) Plan for booster 8 months after your second shot (due 03/10/2020) Cancer screenings   Colon cancer screening: for everyone age 31-75.   Breast cancer screening: mammogram annually after age 26-75, optional after age 6   Cervical cancer screening: Pap every 1 to 5 years depending on age and other risk factors. Can usually stop at age 4 or w/ hysterectomy.   Lung cancer screening: not needed for non-smokers  Infection screenings  . HIV: recommended screening at least once age 63-65, more often as needed. . Gonorrhea/Chlamydia: screening as needed . Hepatitis C: recommended once for everyone age 32-75 . TB: certain at-risk populations, or depending on work requirements and/or travel history Other . Bone Density Test: due next year

## 2020-03-16 DIAGNOSIS — N819 Female genital prolapse, unspecified: Secondary | ICD-10-CM | POA: Diagnosis not present

## 2020-03-16 DIAGNOSIS — Z90710 Acquired absence of both cervix and uterus: Secondary | ICD-10-CM | POA: Diagnosis not present

## 2020-03-16 DIAGNOSIS — Z4689 Encounter for fitting and adjustment of other specified devices: Secondary | ICD-10-CM | POA: Diagnosis not present

## 2020-04-03 ENCOUNTER — Other Ambulatory Visit: Payer: Self-pay | Admitting: Osteopathic Medicine

## 2020-04-14 DIAGNOSIS — Z1152 Encounter for screening for COVID-19: Secondary | ICD-10-CM | POA: Diagnosis not present

## 2020-05-04 DIAGNOSIS — N765 Ulceration of vagina: Secondary | ICD-10-CM | POA: Diagnosis not present

## 2020-05-04 DIAGNOSIS — N950 Postmenopausal bleeding: Secondary | ICD-10-CM | POA: Diagnosis not present

## 2020-05-04 DIAGNOSIS — Z4689 Encounter for fitting and adjustment of other specified devices: Secondary | ICD-10-CM | POA: Diagnosis not present

## 2020-05-04 DIAGNOSIS — N952 Postmenopausal atrophic vaginitis: Secondary | ICD-10-CM | POA: Diagnosis not present

## 2020-05-04 DIAGNOSIS — N898 Other specified noninflammatory disorders of vagina: Secondary | ICD-10-CM | POA: Diagnosis not present

## 2020-05-22 DIAGNOSIS — N8111 Cystocele, midline: Secondary | ICD-10-CM | POA: Diagnosis not present

## 2020-05-22 DIAGNOSIS — Z90722 Acquired absence of ovaries, bilateral: Secondary | ICD-10-CM | POA: Diagnosis not present

## 2020-05-22 DIAGNOSIS — Z90710 Acquired absence of both cervix and uterus: Secondary | ICD-10-CM | POA: Diagnosis not present

## 2020-05-22 DIAGNOSIS — N765 Ulceration of vagina: Secondary | ICD-10-CM | POA: Diagnosis not present

## 2020-05-28 ENCOUNTER — Telehealth: Payer: Self-pay | Admitting: General Practice

## 2020-06-04 ENCOUNTER — Ambulatory Visit (INDEPENDENT_AMBULATORY_CARE_PROVIDER_SITE_OTHER): Payer: Medicare PPO | Admitting: Family Medicine

## 2020-06-04 ENCOUNTER — Telehealth: Payer: Medicare PPO

## 2020-06-04 VITALS — Wt 171.0 lb

## 2020-06-04 DIAGNOSIS — Z0000 Encounter for general adult medical examination without abnormal findings: Secondary | ICD-10-CM

## 2020-06-04 NOTE — Patient Instructions (Addendum)
  MEDICARE ANNUAL WELLNESS VISIT Health Maintenance Summary and Written Plan of Care  Ms. Marie Grimes ,  Thank you for allowing me to perform your Medicare Annual Wellness Visit and for your ongoing commitment to your health.   Health Maintenance & Immunization History Health Maintenance  Topic Date Due  . PNA vac Low Risk Adult (2 of 2 - PPSV23) 01/29/2021 (Originally 01/22/2018)  . TETANUS/TDAP  05/13/2021  . COLONOSCOPY (Pts 45-55yrs Insurance coverage will need to be confirmed)  11/03/2021  . INFLUENZA VACCINE  Completed  . DEXA SCAN  Completed  . COVID-19 Vaccine  Completed  . Hepatitis C Screening  Completed   Immunization History  Administered Date(s) Administered  . Influenza, High Dose Seasonal PF 03/14/2019, 03/08/2020  . Influenza-Unspecified 03/16/2014, 03/22/2015, 03/02/2016  . Moderna Sars-Covid-2 Vaccination 06/14/2019, 07/12/2019, 04/04/2020  . Pneumococcal Conjugate-13 11/24/2012, 01/22/2017  . Pneumococcal Polysaccharide-23 09/12/2000  . Td 05/14/2011  . Tdap 05/14/2011  . Zoster 12/08/2007  . Zoster Recombinat (Shingrix) 02/11/2017    These are the patient goals that we discussed: Goals Addressed            This Visit's Progress   . Patient Stated          This is a list of Health Maintenance Items that are overdue or due now: There are no preventive care reminders to display for this patient.   Orders/Referrals Placed Today: No orders of the defined types were placed in this encounter.  (Contact our referral department at [phone] if you have not spoken with someone about your referral appointment within the next 5 days)    Follow-up Plan Follow up with Dr. Lyn Hollingshead as planned.

## 2020-06-04 NOTE — Progress Notes (Cosign Needed Addendum)
MEDICARE ANNUAL WELLNESS VISIT  06/04/2020  Telephone Visit Disclaimer This Medicare AWV was conducted by telephone due to national recommendations for restrictions regarding the COVID-19 Pandemic (e.g. social distancing).  I verified, using two identifiers, that I am speaking with Marie Grimes or their authorized healthcare agent. I discussed the limitations, risks, security, and privacy concerns of performing an evaluation and management service by telephone and the potential availability of an in-person appointment in the future. The patient expressed understanding and agreed to proceed.  Location of Patient: Home  Location of Provider (nurse):  In the office.  Subjective:    Marie Grimes is a 75 y.o. female patient of [name], DO who had a Medicare Annual Wellness Visit today via telephone. [name] is Retired and lives with their spouse. she has 2 children. she reports that she is socially active and does interact with friends/family regularly. she is moderately physically active and enjoys gardening, reading and spending time with her dogs.  Patient Care Team: [name], DO as PCP - General (Osteopathic Medicine) Servando Salina, MD as Consulting Physician (Obstetrics and Gynecology) Rolm Bookbinder, MD as Consulting Physician (Dermatology)  Advanced Directives 06/04/2020 01/12/2019 06/08/2018 04/16/2018  Does Patient Have a Medical Advance Directive? Yes Yes Yes Yes  Type of Paramedic of [place];Living will [place];Living will [place];Living will [place];Living will  Does patient want to make changes to medical advance directive? No - Patient declined No - Patient declined - No - Patient declined  Copy of [place] in Chart? No - copy requested No - copy requested - No - copy requested    Hospital Utilization Over the Past 12 Months: # of  hospitalizations or ER visits: 0 # of surgeries: 0  Review of Systems    Patient reports that her overall health is better compared to last year.  History obtained from chart review and the patient  Patient Reported Readings (BP, Pulse, CBG, Weight, etc) Weight: 171  Pain Assessment Pain : No/denies pain     Current Medications & Allergies (verified) Allergies as of 06/04/2020      Reactions   Pneumococcal Polysaccharide Vaccine Other (See Comments)   muscle spasms and cramping   Other    Pertussis Vaccines Other (See Comments)   Fever, injection site reaction      Medication List       Accurate as of June 04, 2020  9:45 AM. If you have any questions, ask your nurse or doctor.        albuterol 108 (90 Base) MCG/ACT inhaler Commonly known as: VENTOLIN HFA Inhale 1-2 puffs into the lungs every 4 (four) hours as needed for wheezing or shortness of breath.   CALCIUM PLUS D3 ABSORBABLE PO Take by mouth.   diclofenac Sodium 1 % Gel Commonly known as: VOLTAREN Apply 4 g topically 4 (four) times daily. To affected joint.   fluticasone 50 MCG/ACT nasal spray Commonly known as: FLONASE PLACE 1 SPRAY INTO BOTH NOSTRILS DAILY   Fluticasone-Salmeterol 100-50 MCG/DOSE Aepb Commonly known as: Advair Diskus Inhale 1 puff into the lungs 2 (two) times daily.   levocetirizine 5 MG tablet Commonly known as: XYZAL Take 1 tablet (5 mg total) by mouth daily.   omeprazole 40 MG capsule Commonly known as: PRILOSEC Take 1 capsule (40 mg total) by mouth daily.   WOMENS MULTIVITAMIN PO Take by mouth.   Xiidra 5 % Soln Generic drug:  Lifitegrast Place 1 drop into both eyes 2 (two) times daily.       History (reviewed): Past Medical History:  Diagnosis Date  . Acid reflux   . Allergy   . Asthma   . Environmental and seasonal allergies   . History of basal cell carcinoma    skin cancer  . History of squamous cell carcinoma   . Osteopenia after menopause   . Pelvic  prolapse   . Positive colorectal cancer screening using Cologuard test 08/06/2018   Referred 08/06/18 to GI   Past Surgical History:  Procedure Laterality Date  . ABDOMINAL HYSTERECTOMY     age 14, endometriosis  . BREAST LUMPECTOMY Left    age 45, benign  . CERVICAL CONE BIOPSY    . COLONOSCOPY  2001   states "attempted" and had tortuous  . ELBOW SURGERY    . FRACTURE SURGERY Left 2018   wrist  . REFRACTIVE SURGERY    . SIGMOIDOSCOPY  2001  . TONSILLECTOMY     Family History  Problem Relation Age of Onset  . Hypertension Mother   . Stroke Mother   . Stroke Father   . Diabetes Brother   . Celiac disease Brother   . Rectal cancer Cousin   . Alcohol abuse Son   . Colon cancer Neg Hx   . Esophageal cancer Neg Hx   . Stomach cancer Neg Hx   . Colon polyps Neg Hx    Social History   Socioeconomic History  . Marital status: Married    Spouse name: [name]  . Number of children: 2  . Years of education: 15  . Highest education level: Professional school degree (e.g., MD, DDS, DVM, JD)  Occupational History  . Occupation: Biomedical scientist    Comment: retired professor  Tobacco Use  . Smoking status: Never Smoker  . Smokeless tobacco: Never Used  Vaping Use  . Vaping Use: Never used  Substance and Sexual Activity  . Alcohol use: Yes    Alcohol/week: 4.0 - 7.0 standard drinks    Types: 4 - 7 Glasses of wine per week  . Drug use: Never  . Sexual activity: Not Currently  Other Topics Concern  . Not on file  Social History Narrative   Walks dogs   Does light weight lifting   Play computer games, read   Shopping   gardening   Social Determinants of Health   Financial Resource Strain: Low Risk   . Difficulty of Paying Living Expenses: Not hard at all  Food Insecurity: No Food Insecurity  . Worried About Charity fundraiser in the Last Year: Never true  . Ran Out of Food in the Last Year: Never true  Transportation Needs: No Transportation Needs  . Lack of  Transportation (Medical): No  . Lack of Transportation (Non-Medical): No  Physical Activity: Sufficiently Active  . Days of Exercise per Week: 7 days  . Minutes of Exercise per Session: 30 min  Stress: No Stress Concern Present  . Feeling of Stress : Not at all  Social Connections: Moderately Isolated  . Frequency of Communication with Friends and Family: More than three times a week  . Frequency of Social Gatherings with Friends and Family: More than three times a week  . Attends Religious Services: Never  . Active Member of Clubs or Organizations: No  . Attends Archivist Meetings: Never  . Marital Status: Married    Activities of Daily Living In your present  state of health, do you have any difficulty performing the following activities: 06/04/2020  Hearing? N  Vision? N  Difficulty concentrating or making decisions? N  Walking or climbing stairs? N  Dressing or bathing? N  Doing errands, shopping? N  Preparing Food and eating ? N  Using the Toilet? N  In the past six months, have you accidently leaked urine? N  Do you have problems with loss of bowel control? N  Managing your Medications? N  Managing your Finances? N  Housekeeping or managing your Housekeeping? N  Some recent data might be hidden    Patient Education/ Literacy How often do you need to have someone help you when you read instructions, pamphlets, or other written materials from your doctor or pharmacy?: 1 - Never What is the last grade level you completed in school?: PhD  Exercise Current Exercise Habits: Home exercise routine, Type of exercise: walking, Time (Minutes): 30, Frequency (Times/Week): 7, Weekly Exercise (Minutes/Week): 210, Intensity: Mild, Exercise limited by: None identified  Diet Patient reports consuming 2 meals a day and 1 snack(s) a day Patient reports that her primary diet is: Regular Patient reports that she does have regular access to food.   Depression Screen PHQ 2/9  Scores 06/04/2020 01/30/2020 01/12/2019 01/07/2019 09/09/2018 04/16/2018  PHQ - 2 Score 0 1 0 1 0 0  PHQ- 9 Score - - 0 2 - -     Fall Risk Fall Risk  06/04/2020 01/30/2020 01/12/2019 08/17/2018 04/16/2018  Falls in the past year? 0 0 0 0 0  Number falls in past yr: 0 0 - 0 -  Injury with Fall? 1 0 0 0 -  Follow up Falls evaluation completed - Falls prevention discussed Falls evaluation completed -     Objective:  Shuntay P Waddle seemed alert and oriented and she participated appropriately during our telephone visit.  Blood Pressure Weight BMI  BP Readings from Last 3 Encounters:  01/30/20 (!) 145/89  03/28/19 (!) 150/72  01/12/19 138/78   Wt Readings from Last 3 Encounters:  06/04/20 171 lb (77.6 kg)  01/30/20 175 lb (79.4 kg)  03/28/19 181 lb (82.1 kg)   BMI Readings from Last 1 Encounters:  06/04/20 27.60 kg/m    *Unable to obtain current vital signs, weight, and BMI due to telephone visit type  Hearing/Vision  . Denetra did not seem to have difficulty with hearing/understanding during the telephone conversation . Reports that she has had a formal eye exam by an eye care professional within the past year . Reports that she has not had a formal hearing evaluation within the past year *Unable to fully assess hearing and vision during telephone visit type  Cognitive Function: 6CIT Screen 06/04/2020 01/12/2019  What Year? 0 points 0 points  What month? 0 points 0 points  What time? 0 points 0 points  Count back from 20 0 points 0 points  Months in reverse 0 points 0 points  Repeat phrase 0 points 0 points  Total Score 0 0   (Normal:0-7, Significant for Dysfunction: >8)  Normal Cognitive Function Screening: Yes   Immunization & Health Maintenance Record Immunization History  Administered Date(s) Administered  . Influenza, High Dose Seasonal PF 03/14/2019, 03/08/2020  . Influenza-Unspecified 03/16/2014, 03/22/2015, 03/02/2016  . Moderna Sars-Covid-2 Vaccination 06/14/2019,  07/12/2019, 04/04/2020  . Pneumococcal Conjugate-13 11/24/2012, 01/22/2017  . Pneumococcal Polysaccharide-23 09/12/2000  . Td 05/14/2011  . Tdap 05/14/2011  . Zoster 12/08/2007  . Zoster Recombinat (Shingrix) 02/11/2017  Health Maintenance  Topic Date Due  . PNA vac Low Risk Adult (2 of 2 - PPSV23) 01/29/2021 (Originally 01/22/2018)  . TETANUS/TDAP  05/13/2021  . COLONOSCOPY (Pts 45-63yrs Insurance coverage will need to be confirmed)  11/03/2021  . INFLUENZA VACCINE  Completed  . DEXA SCAN  Completed  . COVID-19 Vaccine  Completed  . Hepatitis C Screening  Completed       Assessment  This is a routine wellness examination for [employer].  Health Maintenance: Due or Overdue There are no preventive care reminders to display for this patient.  Seeley P Ferrone does not need a referral for [employer] Assistance: Care Management:   no Social Work:    no Prescription Assistance:  no Nutrition/Diabetes Education:  no   Plan:  Personalized Goals Goals Addressed            This Visit's Progress   . Patient Stated        Personalized Health Maintenance & Screening Recommendations  Td vaccine  Due later this year  Lung Cancer Screening Recommended: not applicable (Low Dose CT Chest recommended if Age 2-80 years, 30 pack-year currently smoking OR have quit w/in past 15 years) Hepatitis C Screening recommended: no HIV Screening recommended: no  Advanced Directives: Written information was not prepared per patient's request.  Referrals & Orders No orders of the defined types were placed in this encounter.   Follow-up Plan . Follow-up with [name], DO as planned    I have personally reviewed and noted the following in the patient's chart:   . Medical and social history . Use of alcohol, tobacco or illicit drugs  . Current medications and supplements . Functional ability and status . Nutritional status . Physical activity . Advanced  directives . List of other physicians . Hospitalizations, surgeries, and ER visits in previous 12 months . Vitals . Screenings to include cognitive, depression, and falls . Referrals and appointments  In addition, I have reviewed and discussed with Marie Grimes certain preventive protocols, quality metrics, and best practice recommendations. A written personalized care plan for preventive services as well as general preventive health recommendations is available and can be mailed to the patient at her request.      Tinnie Gens  06/04/2020

## 2020-06-05 DIAGNOSIS — Z90710 Acquired absence of both cervix and uterus: Secondary | ICD-10-CM | POA: Diagnosis not present

## 2020-06-05 DIAGNOSIS — N765 Ulceration of vagina: Secondary | ICD-10-CM | POA: Diagnosis not present

## 2020-06-05 DIAGNOSIS — N8111 Cystocele, midline: Secondary | ICD-10-CM | POA: Diagnosis not present

## 2020-06-06 NOTE — Telephone Encounter (Signed)
Documentation

## 2020-06-25 DIAGNOSIS — N816 Rectocele: Secondary | ICD-10-CM | POA: Diagnosis not present

## 2020-06-25 DIAGNOSIS — Z90710 Acquired absence of both cervix and uterus: Secondary | ICD-10-CM | POA: Diagnosis not present

## 2020-06-25 DIAGNOSIS — N765 Ulceration of vagina: Secondary | ICD-10-CM | POA: Diagnosis not present

## 2020-06-25 DIAGNOSIS — N8110 Cystocele, unspecified: Secondary | ICD-10-CM | POA: Diagnosis not present

## 2020-06-28 DIAGNOSIS — Z1231 Encounter for screening mammogram for malignant neoplasm of breast: Secondary | ICD-10-CM | POA: Diagnosis not present

## 2020-06-28 LAB — HM MAMMOGRAPHY

## 2020-07-10 ENCOUNTER — Encounter: Payer: Self-pay | Admitting: Osteopathic Medicine

## 2020-07-12 ENCOUNTER — Other Ambulatory Visit: Payer: Self-pay | Admitting: Osteopathic Medicine

## 2020-07-24 DIAGNOSIS — H04123 Dry eye syndrome of bilateral lacrimal glands: Secondary | ICD-10-CM | POA: Diagnosis not present

## 2020-07-24 DIAGNOSIS — H25813 Combined forms of age-related cataract, bilateral: Secondary | ICD-10-CM | POA: Diagnosis not present

## 2020-07-26 DIAGNOSIS — N8110 Cystocele, unspecified: Secondary | ICD-10-CM | POA: Diagnosis not present

## 2020-07-26 DIAGNOSIS — Z90710 Acquired absence of both cervix and uterus: Secondary | ICD-10-CM | POA: Diagnosis not present

## 2020-07-26 DIAGNOSIS — N819 Female genital prolapse, unspecified: Secondary | ICD-10-CM | POA: Diagnosis not present

## 2020-07-26 DIAGNOSIS — Z4689 Encounter for fitting and adjustment of other specified devices: Secondary | ICD-10-CM | POA: Diagnosis not present

## 2020-08-08 ENCOUNTER — Other Ambulatory Visit: Payer: Self-pay

## 2020-08-08 ENCOUNTER — Ambulatory Visit: Payer: Medicare PPO | Admitting: Sports Medicine

## 2020-08-08 ENCOUNTER — Ambulatory Visit (INDEPENDENT_AMBULATORY_CARE_PROVIDER_SITE_OTHER): Payer: Medicare PPO

## 2020-08-08 DIAGNOSIS — M65341 Trigger finger, right ring finger: Secondary | ICD-10-CM

## 2020-08-08 DIAGNOSIS — M7989 Other specified soft tissue disorders: Secondary | ICD-10-CM

## 2020-08-08 NOTE — Assessment & Plan Note (Signed)
Continues to be resolved after trigger finger injection a year ago.

## 2020-08-08 NOTE — Assessment & Plan Note (Signed)
Trigger finger continues to be resolved after injection year, there is some swelling of the right fourth PIP, failed oral analgesics and NSAIDs, injected today, return to see me in a month.

## 2020-08-08 NOTE — Progress Notes (Signed)
    Procedures performed today:    Procedure: Real-time Ultrasound Guided injection of the right fourth PIP Device: Samsung HS60  Verbal informed consent obtained.  Time-out conducted.  Noted no overlying erythema, induration, or other signs of local infection.  Skin prepped in a sterile fashion.  Local anesthesia: Topical Ethyl chloride.  With sterile technique and under real time ultrasound guidance:  Noted synovitis, 1/2 cc kenalog 40, 1/2 cc lidocaine injected easily.   Completed without difficulty  Advised to call if fevers/chills, erythema, induration, drainage, or persistent bleeding.  Images permanently stored and available for review in PACS.  Impression: Technically successful ultrasound guided injection.  Independent interpretation of notes and tests performed by another provider:   None.  Brief History, Exam, Impression, and Recommendations:    Right fourth PIP swelling Trigger finger continues to be resolved after injection year, there is some swelling of the right fourth PIP, failed oral analgesics and NSAIDs, injected today, return to see me in a month.  Trigger finger, right ring finger Continues to be resolved after trigger finger injection a year ago.    ___________________________________________ Gwen Her. Dianah Field, M.D., ABFM., CAQSM. Primary Care and [place] Instructor of [place] [facility]

## 2020-08-09 ENCOUNTER — Encounter: Payer: Self-pay | Admitting: Osteopathic Medicine

## 2020-09-04 ENCOUNTER — Ambulatory Visit: Payer: Medicare PPO | Admitting: Sports Medicine

## 2020-09-04 ENCOUNTER — Ambulatory Visit (INDEPENDENT_AMBULATORY_CARE_PROVIDER_SITE_OTHER): Payer: Medicare PPO

## 2020-09-04 ENCOUNTER — Other Ambulatory Visit: Payer: Self-pay

## 2020-09-04 DIAGNOSIS — M7989 Other specified soft tissue disorders: Secondary | ICD-10-CM | POA: Diagnosis not present

## 2020-09-04 DIAGNOSIS — M25561 Pain in right knee: Secondary | ICD-10-CM | POA: Diagnosis not present

## 2020-09-04 DIAGNOSIS — M25562 Pain in left knee: Secondary | ICD-10-CM | POA: Diagnosis not present

## 2020-09-04 DIAGNOSIS — S8992XA Unspecified injury of left lower leg, initial encounter: Secondary | ICD-10-CM | POA: Diagnosis not present

## 2020-09-04 DIAGNOSIS — G8929 Other chronic pain: Secondary | ICD-10-CM

## 2020-09-04 DIAGNOSIS — M1711 Unilateral primary osteoarthritis, right knee: Secondary | ICD-10-CM | POA: Diagnosis not present

## 2020-09-04 NOTE — Progress Notes (Signed)
    Procedures performed today:    None.  Independent interpretation of notes and tests performed by another provider:   None.  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 We injected her PIP at the last visit on the right ring finger and she returns today pain-free, it is slightly larger than the other knuckles but this is expected with osteoarthritis, no further intervention needed.  Chronic pain of left knee Money is 75 years old, this is likely mild osteoarthritis with degenerative meniscal tearing, trace effusion, the knee is stable but there is pain with terminal flexion with a minimally positive McMurray sign. Because she is not getting significant mechanical symptoms that she tells me her symptoms are well controlled with Tylenol we will get some x-rays and leave this alone for now.     ___________________________________________ Gwen Her. Dianah Field, M.D., ABFM., CAQSM. Primary Care and [place] Instructor of [place] [facility]

## 2020-09-04 NOTE — Assessment & Plan Note (Signed)
Marie Grimes is 75 years old, this is likely mild osteoarthritis with degenerative meniscal tearing, trace effusion, the knee is stable but there is pain with terminal flexion with a minimally positive McMurray sign. Because she is not getting significant mechanical symptoms that she tells me her symptoms are well controlled with Tylenol we will get some x-rays and leave this alone for now.

## 2020-09-04 NOTE — Assessment & Plan Note (Signed)
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 We injected her PIP at the last visit on the right ring finger and she returns today pain-free, it is slightly larger than the other knuckles but this is expected with osteoarthritis, no further intervention needed.

## 2020-09-20 DIAGNOSIS — N8110 Cystocele, unspecified: Secondary | ICD-10-CM | POA: Diagnosis not present

## 2020-09-20 DIAGNOSIS — N8111 Cystocele, midline: Secondary | ICD-10-CM | POA: Diagnosis not present

## 2020-09-20 DIAGNOSIS — Z4689 Encounter for fitting and adjustment of other specified devices: Secondary | ICD-10-CM | POA: Diagnosis not present

## 2020-09-25 ENCOUNTER — Other Ambulatory Visit: Payer: Self-pay | Admitting: Osteopathic Medicine

## 2020-10-24 DIAGNOSIS — I8311 Varicose veins of right lower extremity with inflammation: Secondary | ICD-10-CM | POA: Diagnosis not present

## 2020-10-24 DIAGNOSIS — I8312 Varicose veins of left lower extremity with inflammation: Secondary | ICD-10-CM | POA: Diagnosis not present

## 2020-10-25 DIAGNOSIS — Z4689 Encounter for fitting and adjustment of other specified devices: Secondary | ICD-10-CM | POA: Diagnosis not present

## 2020-10-25 DIAGNOSIS — N819 Female genital prolapse, unspecified: Secondary | ICD-10-CM | POA: Diagnosis not present

## 2020-10-25 DIAGNOSIS — N8111 Cystocele, midline: Secondary | ICD-10-CM | POA: Diagnosis not present

## 2020-10-25 DIAGNOSIS — N816 Rectocele: Secondary | ICD-10-CM | POA: Diagnosis not present

## 2020-10-25 DIAGNOSIS — N8110 Cystocele, unspecified: Secondary | ICD-10-CM | POA: Diagnosis not present

## 2020-10-31 DIAGNOSIS — I8311 Varicose veins of right lower extremity with inflammation: Secondary | ICD-10-CM | POA: Diagnosis not present

## 2020-10-31 DIAGNOSIS — I8312 Varicose veins of left lower extremity with inflammation: Secondary | ICD-10-CM | POA: Diagnosis not present

## 2020-11-15 ENCOUNTER — Telehealth (INDEPENDENT_AMBULATORY_CARE_PROVIDER_SITE_OTHER): Payer: Medicare PPO | Admitting: Physician Assistant

## 2020-11-15 ENCOUNTER — Other Ambulatory Visit: Payer: Self-pay

## 2020-11-15 ENCOUNTER — Telehealth: Payer: Self-pay

## 2020-11-15 ENCOUNTER — Encounter: Payer: Self-pay | Admitting: Physician Assistant

## 2020-11-15 VITALS — Temp 99.7°F

## 2020-11-15 DIAGNOSIS — U071 COVID-19: Secondary | ICD-10-CM

## 2020-11-15 DIAGNOSIS — R059 Cough, unspecified: Secondary | ICD-10-CM

## 2020-11-15 MED ORDER — BENZONATATE 200 MG PO CAPS: 200.0000 mg | ORAL_CAPSULE | Freq: Three times a day (TID) | ORAL | 0 refills | Status: DC | PRN

## 2020-11-15 MED ORDER — NIRMATRELVIR/RITONAVIR (PAXLOVID)TABLET: 3.0000 | ORAL_TABLET | Freq: Two times a day (BID) | ORAL | 0 refills | Status: DC

## 2020-11-15 MED ORDER — NIRMATRELVIR/RITONAVIR (PAXLOVID)TABLET
3.0000 | ORAL_TABLET | Freq: Two times a day (BID) | ORAL | 0 refills | Status: AC
Start: 2020-11-15 — End: 2020-11-20

## 2020-11-15 NOTE — Progress Notes (Signed)
Telemedicine Visit via  Video & Audio (App used: Caregility/MyChart) Audio only - telephone (patient preference /  technical difficulty with MyChart video application)  I connected with [name] on 11/15/20 at 4:39 PM  by phone or  telemedicine application as noted above  I verified that I am speaking with or regarding  the correct patient using two identifiers.  Participants: Myself, Nelson Chimes PA-C Patient: Marie Grimes Patient proxy if applicable: N/A Other, if applicable: N/A  Patient is at home in [facility] I am in office at [facility]    I discussed the limitations of evaluation and management  by telemedicine and the availability of in person appointments.  The participant(s) above expressed understanding and  agreed to proceed with this appointment via telemedicine.       History of Present Illness: Marie Grimes is a 75 y.o. female who would like to discuss COVID-19 symptoms   Tested + today 11/15/20 She is immunized with Moderna and 1 booster Symptoms began yesterday - did not feel right and had a couple of coughing spells Symptoms gradually worsening Temperature of 99.22F this AM Endorses fatigue, malaise, non-productive cough Personal hx of chronic bronchitis, asthma, and PNA (2011) Taking Advair and Albuterol for asthma as prescribed She is also taking Zinc and Vitamin C, Ca/Vit D supplements No other treatments tried   Observations/Objective: Temp 99.7 F (37.6 C)  BP Readings from Last 3 Encounters:  01/30/20 (!) 145/89  03/28/19 (!) 150/72  01/12/19 138/78   Exam: Gen: alert,non-toxic appearing Pulm: speaking in full sentences  Lab and Radiology Results No results found for this or any previous visit (from the past 72 hour(s)). No results found.  Lab Results  Component Value Date   CREATININE 0.88 01/26/2020   BUN 14 01/26/2020   NA 135 01/26/2020   K 4.3 01/26/2020   CL 100 01/26/2020   CO2 28 01/26/2020       Assessment and Plan: 75 y.o. female with The primary encounter diagnosis was COVID-19 virus infection. A diagnosis of Cough was also pertinent to this visit.  Patient is a candidate for Paxlovid based on age/comorbidity Reviewed risks and potential adverse effects - recommend she discuss use of Advair with Paxlovid with pharmacist (not an absolute contraindication, can increase concentration of Salmeterol) Personally reviewed renal function dated 01/25/21 - GFR>60, no prior hx of renal insufficiency Counseled on supportive care Counseled patient to isolate until 11/19/20 and wear a mask around others until 11/24/20 Counseled on ER precautions Follow-up prn  PDMP not reviewed this encounter. No orders of the defined types were placed in this encounter.  Meds ordered this encounter  Medications   DISCONTD: nirmatrelvir/ritonavir EUA (PAXLOVID) TABS    Sig: Take 3 tablets by mouth 2 (two) times daily for 5 days. (Take nirmatrelvir 150 mg two tablets twice daily for 5 days and ritonavir 100 mg one tablet twice daily for 5 days) Patient GFR is 64    Dispense:  30 tablet    Refill:  0    Order Specific Question:   Supervising Provider    Answer:   [name] [[id number]]   benzonatate (TESSALON) 200 MG capsule    Sig: Take 1 capsule (200 mg total) by mouth 3 (three) times daily as needed for cough.    Dispense:  30 capsule    Refill:  0    Order Specific Question:   Supervising Provider    Answer:   [name] [[id number]]  Patient Instructions  [email or web address]  COVID-19: Quarantine and Isolation Quarantine If you were exposed Quarantine and stay away from others when you have been in close contact with someone whohas COVID-19. Isolate If you are sick or test positive Isolate when you are sick or when you have COVID-19, even if you don't have symptoms. When to stay home Calculating quarantine The date of your exposure is considered day  0. Day 1 is the first full day after your last contact with a person who has had COVID-19. Stay home and away from other people for at least 5 days. Learn why CDC updated guidance for the general public. IF YOU were exposed to COVID-19 and are NOT up-to-date IF YOU were exposed to COVID-19 and are NOT on COVID-19 vaccinations Quarantine for at least 5 days Stay home Stay home and quarantine for at least 5 full days. Wear a well-fitted mask if you must be around others in your home. Do not travel. Get tested Even if you don't develop symptoms, get tested at least 5 days after you last had close contact with someone with COVID-19. After quarantine Watch for symptoms Watch for symptoms until 10 days after you last had close contact with someone with COVID-19. Avoid travel It is best to avoid travel until a full 10 days after you last had close contact with someone with COVID-19. If you develop symptoms Isolate immediately and get tested. Continue to stay home until you know the results. Wear a well-fitted mask around others. Take precautions until day 10 Wear a mask Wear a well-fitted mask for 10 full days any time you are around others inside your home or in public. Do not go to places where you are unable to wear a mask. If you must travel during days 6-10, take precautions. Avoid being around people who are at high risk IF YOU were exposed to COVID-19 and are up-to-date IF YOU were exposed to COVID-19 and are on COVID-19 vaccinations No quarantine You do not need to stay home unless you develop symptoms. Get tested Even if you don't develop symptoms, get tested at least 5 days after you last had close contact with someone with COVID-19. Watch for symptoms Watch for symptoms until 10 days after you last had close contact with someone with COVID-19. If you develop symptoms Isolate immediately and get tested. Continue to stay home until you know the results. Wear a well-fitted mask  around others. Take precautions until day 10 Wear a mask Wear a well-fitted mask for 10 full days any time you are around others inside your home or in public. Do not go to places where you are unable to wear a mask. Take precautions if traveling Avoid being around people who are at high risk IF YOU were exposed to COVID-19 and had confirmed COVID-19 within the past 90 days (you tested positive using a viral test) No quarantine You do not need to stay home unless you develop symptoms. Watch for symptoms Watch for symptoms until 10 days after you last had close contact with someone with COVID-19. If you develop symptoms Isolate immediately and get tested. Continue to stay home until you know the results. Wear a well-fitted mask around others. Take precautions until day 10 Wear a mask Wear a well-fitted mask for 10 full days any time you are around others inside your home or in public. Do not go to places where you are unable to wear a mask. Take precautions if traveling Avoid being around people who  are at high risk Calculating isolation Day 0 is your first day of symptoms or a positive viral test. Day 1 is the first full day after your symptoms developed or your test specimen was collected. If you have COVID-19 or have symptoms, isolate for at least 5 days. IF YOU tested positive for COVID-19 or have symptoms, regardless of vaccination status Stay home for at least 5 days Stay home for 5 days and isolate from others in your home. Wear a well-fitted mask if you must be around others in your home. Do not travel. Ending isolation if you had symptoms End isolation after 5 full days if you are fever-free for 24 hours (without the use of fever-reducing medication) and your symptoms are improving. Ending isolation if you did NOT have symptoms End isolation after at least 5 full days after your positive test. If you were severely ill with COVID-19 or are immunocompromised You should isolate for  at least 10 days. Consult your doctor before ending isolation. Take precautions until day 10 Wear a mask Wear a well-fitted mask for 10 full days any time you are around others inside your home or in public. Do not go to places where you are unable to wear a mask. Do not travel Do not travel until a full 10 days after your symptoms started or the date your positive test was taken if you had no symptoms. Avoid being around people who are at high risk Definitions Exposure Contact with someone infected with SARS-CoV-2, the virus that causes COVID-19,in a way that increases the likelihood of getting infected with the virus. Close contact A close contact is someone who was less than 6 feet away from an infected person (laboratory-confirmed or a clinical diagnosis) for a cumulative total of 15 minutes or more over a 24-hour period. For example, three individual 5-minute exposures for a total of 15 minutes. People who are exposed to someone with COVID-19 after they completed at least 5 days of isolation are notconsidered close contacts. Julio Sicks is a strategy used to prevent transmission of COVID-19 by keeping people who have been in close contact with someone with COVID-19 apart from others. Who does not need to quarantine? If you had close contact with someone with COVID-19 and you are in one of the following groups, you do not need to quarantine. You are up to date with your COVID-19 vaccines. You had confirmed COVID-19 within the last 90 days (meaning you tested positive using a viral test). You should wear a well-fitting mask around others for 10 days from the date of your last close contact with someone with COVID-19 (the date of last close contact is considered day 0). Get tested at least 5 days after you last had close contact with someone with COVID-19. If you test positive or develop COVID-19 symptoms, isolate from other people and follow recommendations in the Isolation section  below. If you tested positive for COVID-19 with a viral test within the previous 90 days and subsequently recovered and remain without COVID-19 symptoms, you do not need to quarantine or get tested after close contact. You should wear a well-fitting mask around others for 10 days from the date of your last close contact withsomeone with COVID-19 (the date of last close contact is considered day 0). Who should quarantine? If you come into close contact with someone with COVID-19, you should quarantine if you are not up to date on COVID-19 vaccines. This includes people who are not vaccinated. What to do  for quarantine Stay home and away from other people for at least 5 days (day 0 through day 5) after your last contact with a person who has COVID-19. The date of your exposure is considered day 0. Wear a well-fitting mask when around others at home, if possible. For 10 days after your last close contact with someone with COVID-19, watch for fever (100.31F or greater), cough, shortness of breath, or other COVID-19 symptoms. If you develop symptoms, get tested immediately and isolate until you receive your test results. If you test positive, follow isolation recommendations. If you do not develop symptoms, get tested at least 5 days after you last had close contact with someone with COVID-19. If you test negative, you can leave your home, but continue to wear a well-fitting mask when around others at home and in public until 10 days after your last close contact with someone with COVID-19. If you test positive, you should isolate for at least 5 days from the date of your positive test (if you do not have symptoms). If you do develop COVID-19 symptoms, isolate for at least 5 days from the date your symptoms began (the date the symptoms started is day 0). Follow recommendations in the isolation section below. If you are unable to get a test 5 days after last close contact with someone with COVID-19, you can  leave your home after day 5 if you have been without COVID-19 symptoms throughout the 5-day period. Wear a well-fitting mask for 10 days after your date of last close contact when around others at home and in public. Avoid people who are immunocompromised or at high risk for severe disease, and nursing homes and other high-risk settings, until after at least 10 days. If possible, stay away from people you live with, especially people who are at higher risk for getting very sick from COVID-19, as well as others outside your home throughout the full 10 days after your last close contact with someone with COVID-19. If you are unable to quarantine, you should wear a well-fitting mask for 10 days when around others at home and in public. If you are unable to wear a mask when around others, you should continue to quarantine for 10 days. Avoid people who are immunocompromised or at high risk for severe disease, and nursing homes and other high-risk settings, until after at least 10 days. See additional information about travel. Do not go to places where you are unable to wear a mask, such as restaurants and some gyms, and avoid eating around others at home and at work until after 10 days after your last close contact with someone with COVID-19. After quarantine Watch for symptoms until 10 days after your last close contact with someone with COVID-19. If you have symptoms, isolate immediately and get tested. Quarantine in high-risk congregate settings In certain congregate settings that have high risk of secondary transmission (such as Systems analyst and detention facilities, homeless shelters, or cruise ships), CDC recommends a 10-day quarantine for residents, regardless of vaccination and booster status. During periods of critical staffing shortages, facilities may consider shortening the quarantine period for staff to ensure continuity of operations. Decisions to shorten quarantine in these settings should be  made in consultation with state, local, tribal, or territorial health departments and should take into consideration the context and characteristics of the facility. CDC's setting-specific guidance provides additional recommendations for these settings. Isolation Isolation is used to separate people with confirmed or suspected COVID-19 from those without COVID-19. People  who are in isolation should stay home until it's safe for them to be around others. At home, anyone sick or infected should separate from others, or wear a well-fitting mask when they need to be around others. People in isolation should stay in a specific "sick room" or area and use a separate bathroom if available. Everyone who has presumed or confirmed COVID-19 should stay home and isolate from other people for at least 5 full days (day 0 is the first day of symptoms or the date of the day of the positive viral test for asymptomatic persons). They should wear a mask when around others at home and in public for an additional 5 days. People who are confirmed to have COVID-19 or are showing symptoms of COVID-19 need to isolate regardless of their vaccination status. This includes: People who have a positive viral test for COVID-19, regardless of whether or not they have symptoms. People with symptoms of COVID-19, including people who are awaiting test results or have not been tested. People with symptoms should isolate even if they do not know if they have been in close contact with someone with COVID-19. What to do for isolation Monitor your symptoms. If you have an emergency warning sign (including trouble breathing), seek emergency medical care immediately. Stay in a separate room from other household members, if possible. Use a separate bathroom, if possible. Take steps to improve ventilation at home, if possible. Avoid contact with other members of the household and pets. Don't share personal household items, like cups, towels, and  utensils. Wear a well-fitting mask when you need to be around other people. Learn more about what to do if you are sick and how to notify your contacts. Ending isolation for people who had COVID-19 and had symptoms If you had COVID-19 and had symptoms, isolate for at least 5 days. To calculate your 5-day isolation period, day 0 is your first day of symptoms. Day 1 is the first full day after your symptoms developed. You can leave isolation after 5 full days. You can end isolation after 5 full days if you are fever-free for 24 hours without the use of fever-reducing medication and your other symptoms have improved (Loss of taste and smell may persist for weeks or months after recovery and need not delay the end of isolation). You should continue to wear a well-fitting mask around others at home and in public for 5 additional days (day 6 through day 10) after the end of your 5-day isolation period. If you are unable to wear a mask when around others, you should continue to isolate for a full 10 days. Avoid people who are immunocompromised or at high risk for severe disease, and nursing homes and other high-risk settings, until after at least 10 days. If you continue to have fever or your other symptoms have not improved after 5 days of isolation, you should wait to end your isolation until you are fever-free for 24 hours without the use of fever-reducing medication and your other symptoms have improved. Continue to wear a well-fitting mask. Contact your healthcare provider if you have questions. See additional information about travel. Do not go to places where you are unable to wear a mask, such as restaurants and some gyms, and avoid eating around others at home and at work until a full 10 days after your first day of symptoms. If an individual has access to a test and wants to test, the best approach is to use an antigen  test1 towards the end of the 5-day isolation period. Collect the test sample only if  you are fever-free for 24 hours without the use of fever-reducing medication and your other symptoms have improved (loss of taste and smell may persist for weeks or months after recovery and need not delay the end of isolation). If your test result is positive, you should continue to isolate until day 10. If your test result is negative, you can end isolation, but continue to wear a well-fitting mask around others at home and in public until day 10. Follow additional recommendations for masking and avoiding travel as described above. 1As noted in the labeling for authorized over-the counter antigen tests: Negative results should be treated as presumptive. Negative results do not rule out SARS-CoV-2 infection and should not be used as the sole basis for treatment or patient management decisions, including infection control decisions. To improve results, antigen tests should be used twice over a three-day period with at least 24 hours and no more than 48 hours between tests. Note that these recommendations on ending isolation do not apply to people with moderate or severe COVID-19 or with weakened immune systems (immunocompromised). See section below for recommendations for when toend isolation for these groups. Ending isolation for people who tested positive for COVID-19 but had no symptoms If you test positive for COVID-19 and never develop symptoms, isolate for at least 5 days. Day 0 is the day of your positive viral test (based on the date you were tested) and day 1 is the first full day after the specimen was collected for your positive test. You can leave isolation after 5 full days. If you continue to have no symptoms, you can end isolation after at least 5 days. You should continue to wear a well-fitting mask around others at home and in public until day 10 (day 6 through day 10). If you are unable to wear a mask when around others, you should continue to isolate for 10 days. Avoid people who are  immunocompromised or at high risk for severe disease, and nursing homes and other high-risk settings, until after at least 10 days. If you develop symptoms after testing positive, your 5-day isolation period should start over. Day 0 is your first day of symptoms. Follow the recommendations above for ending isolation for people who had COVID-19 and had symptoms. See additional information about travel. Do not go to places where you are unable to wear a mask, such as restaurants and some gyms, and avoid eating around others at home and at work until 10 days after the day of your positive test. If an individual has access to a test and wants to test, the best approach is to use an antigen test1 towards the end of the 5-day isolation period. If your test result is positive, you should continue to isolate until day 10. If your test result is negative, you can end isolation, but continue to wear a well-fitting mask around others at home and in public until day 10. Follow additionalrecommendations for masking and avoiding travel as described above. 1As noted in the labeling for authorized over-the counter antigen tests: Negative results should be treated as presumptive. Negative results do not rule out SARS-CoV-2 infection and should not be used as the sole basis for treatment or patient management decisions, including infection control decisions. To improve results, antigen tests should be used twice over a three-day period with at least 24 hours and no more than 48 hours between tests.  Ending isolation for people who were severely ill with COVID-19 or have a weakened immune system (immunocompromised) People who are severely ill with COVID-19 (including those who were hospitalized or required intensive care or ventilation support) and people with compromised immune systems might need to isolate at home longer. They may also require testing with a viral test to determine when they can be around others. CDC  recommends an isolation period of at least 10 and up to 20 days for people who were severely ill with COVID-19 and for people with weakened immune systems. Consult with your healthcare provider about when you can resume being aroundother people. People who are immunocompromised should talk to their healthcare provider about the potential for reduced immune responses to COVID-19 vaccines and the need to continue to follow current prevention measures (including wearing a well-fitting mask, staying 6 feet apart from others they don't live with, and avoiding crowds and poorly ventilated indoor spaces) to protect themselves against COVID-19 until advised otherwise by their healthcare provider. Close contacts of immunocompromised people--including household members--should also be encouraged to receive all recommended COVID-19 vaccine doses to help protect these people. Isolation in high-risk congregate settings In certain high-risk congregate settings that have high risk of secondary transmission and where it is not feasible to cohort people (such as Systems analyst and detention facilities, homeless shelters, and cruise ships), CDC recommends a 10-day isolation period for residents. During periods of critical staffing shortages, facilities may consider shortening the isolation period for staff to ensure continuity of operations. Decisions to shorten isolation in these settings should be made in consultation with state, local, tribal, or territorial health departments and should take into consideration the context and characteristics of the facility. CDC's setting-specific guidance provides additional recommendations for these settings. This CDC guidance is meant to supplement--not replace--any federal, state, local,territorial, or tribal health and safety laws, rules, and regulations. Recommendations for specific settings These recommendations do not apply to healthcare professionals. For guidance specific to these  settings, see Healthcare professionals: Interim Guidance for Optician, dispensing with SARS-CoV-2 Infection or Exposure to SARS-CoV-2 Patients, residents, and visitors to healthcare settings: Interim Infection Prevention and Control Recommendations for Healthcare Personnel During the [place] 2019 (COVID-19) Pandemic Additional setting-specific guidance and recommendations are available. These recommendations on quarantine and isolation do apply to [place] settings. Additional guidance is available here: Overview of COVID-19 Quarantine for K-12 Schools Travelers: Travel information and recommendations Congregate facilities and other settings: [employer] for community, work, and school settings Ongoing COVID-19 exposure FAQs I live with someone with COVID-19, but I cannot be separated from them. How do we manage quarantine in this situation? It is very important for people with COVID-19 to remain apart from other people, if possible, even if they are living together. If separation of the person with COVID-19 from others that they live with is not possible, the other people that they live with will have ongoing exposure, meaning they will be repeatedly exposed until that person is no longer able to spread the virus to other people. In this situation, there are precautions you can take to limit the spread of COVID-19: The person with COVID-19 and everyone they live with should wear a well-fitting mask inside the home. If possible, one person should care for the person with COVID-19 to limit the number of people who are in close contact with the infected person. Take steps to protect yourself and others to reduce transmission in the home: Quarantine if you are not up  to date with your COVID-19 vaccines. Isolate if you are sick or tested positive for COVID-19, even if you don't have symptoms. Learn more about the public health recommendations for testing, mask use and quarantine  of close contacts, like yourself, who have ongoing exposure. These recommendations differ depending on your vaccination status. What should I do if I have ongoing exposure to COVID-19 from someone I live with? Recommendations for this situation depend on your vaccination status: If you are not up to date on COVID-19 vaccines and have ongoing exposure to COVID-19, you should: Begin quarantine immediately and continue to quarantine throughout the isolation period of the person with COVID-19. Continue to quarantine for an additional 5 days starting the day after the end of isolation for the person with COVID-19. Get tested at least 5 days after the end of isolation of the infected person that lives with them. If you test negative, you can leave the home but should continue to wear a well-fitting mask when around others at home and in public until 10 days after the end of isolation for the person with COVID-19. Isolate immediately if you develop symptoms of COVID-19 or test positive. If you are up to date with COVID-19 vaccines and have ongoing exposure to COVID-19, you should: Get tested at least 5 days after your first exposure. A person with COVID-19 is considered infectious starting 2 days before they develop symptoms, or 2 days before the date of their positive test if they do not have symptoms. Get tested again at least 5 days after the end of isolation for the person with COVID-19. Wear a well-fitting mask when you are around the person with COVID-19, and do this throughout their isolation period. Wear a well-fitting mask around others for 10 days after the infected person's isolation period ends. Isolate immediately if you develop symptoms of COVID-19 or test positive. What should I do if multiple people I live with test positive for COVID-19 at different times? Recommendations for this situation depend on your vaccination status: If you are not up to date with your COVID-19 vaccines, you  should: Quarantine throughout the isolation period of any infected person that you live with. Continue to quarantine until 5 days after the end of isolation date for the most recently infected person that lives with you. For example, if the last day of isolation of the person most recently infected with COVID-19 was June 30, the new 5-day quarantine period starts on July 1. Get tested at least 5 days after the end of isolation for the most recently infected person that lives with you. Wear a well-fitting mask when you are around any person with COVID-19 while that person is in isolation. Wear a well-fitting mask when you are around other people until 10 days after your last close contact. Isolate immediately if you develop symptoms of COVID-19 or test positive. If you are up to date with COVID-19 your vaccines , you should: Get tested at least 5 days after your first exposure. A person with COVID-19 is considered infectious starting 2 days before they developed symptoms, or 2 days before the date of their positive test if they do not have symptoms. Get tested again at least 5 days after the end of isolation for the most recently infected person that lives with you. Wear a well-fitting mask when you are around any person with COVID-19 while that person is in isolation. Wear a well-fitting mask around others for 10 days after the end of isolation for  the most recently infected person that lives with you. For example, if the last day of isolation for the person most recently infected with COVID-19 was June 30, the new 10-day period to wear a well-fitting mask indoors in public starts on July 1. Isolate immediately if you develop symptoms of COVID-19 or test positive. I had COVID-19 and completed isolation. Do I have to quarantine or get tested if someone I live with gets COVID-19 shortly after I completed isolation? No. If you recently completed isolation and someone that lives with you tests positive for  the virus that causes COVID-19 shortly after the end of your isolation period, you do not have to quarantine or get tested as long as you do not develop new symptoms. Once all of the people that live together have completed isolation or quarantine, refer to the guidance below for new exposures to COVID-19. If you had COVID-19 in the previous 90 days and then came into close contact with someone with COVID-19, you do not have to quarantine or get tested if you do not have symptoms. But you should: Wear a well-fitting mask indoors in public for 10 days after exposure. Monitor for COVID-19 symptoms and isolate immediately if symptoms develop. Consult with a healthcare provider for testing recommendations if new symptoms develop. If more than 90 days have passed since your recovery from infection, follow CDC's recommendations for close contacts. These recommendations will differ depending on your vaccination status. 06/28/2020 Content source: [facility] ([facility]), Division of Viral Diseases This information is not intended to replace advice given to you by your health care provider. Make sure you discuss any questions you have with your healthcare provider. Document Revised: 07/06/2020 Document Reviewed: 07/06/2020 Elsevier Patient Education  [place].   Instructions sent via [place].   Follow Up Instructions: No follow-ups on file.    I discussed the assessment and treatment plan with the patient. The patient was provided an opportunity to ask questions and all were answered. The patient agreed with the plan and demonstrated an understanding of the instructions.   The patient was advised to call back or seek an in-person evaluation if any new concerns, if symptoms worsen or if the condition fails to improve as anticipated.  25 minutes of non-face-to-face time was provided during this  encounter.      . . . . . . . . . . . . . Marland Kitchen                   Historical information moved to improve visibility of documentation.  Past Medical History:  Diagnosis Date   Acid reflux    Allergy    Asthma    Environmental and seasonal allergies    History of basal cell carcinoma    skin cancer   History of squamous cell carcinoma    Osteopenia after menopause    Pelvic prolapse    Positive colorectal cancer screening using Cologuard test 08/06/2018   Referred 08/06/18 to GI   Past Surgical History:  Procedure Laterality Date   ABDOMINAL HYSTERECTOMY     age 19, endometriosis   BREAST LUMPECTOMY Left    age 39, benign   CERVICAL CONE BIOPSY     COLONOSCOPY  2001   states "attempted" and had tortuous   ELBOW SURGERY     FRACTURE SURGERY Left 2018   wrist   REFRACTIVE SURGERY     SIGMOIDOSCOPY  2001   TONSILLECTOMY  Social History   Tobacco Use   Smoking status: Never   Smokeless tobacco: Never  Substance Use Topics   Alcohol use: Yes    Alcohol/week: 4.0 - 7.0 standard drinks    Types: 4 - 7 Glasses of wine per week   family history includes Alcohol abuse in her son; Celiac disease in her brother; Diabetes in her brother; Hypertension in her mother; Rectal cancer in her cousin; Stroke in her father and mother.  Medications: Current Outpatient Medications  Medication Sig Dispense Refill   albuterol (VENTOLIN HFA) 108 (90 Base) MCG/ACT inhaler Inhale 1-2 puffs into the lungs every 4 (four) hours as needed for wheezing or shortness of breath. 18 g 99   benzonatate (TESSALON) 200 MG capsule Take 1 capsule (200 mg total) by mouth 3 (three) times daily as needed for cough. 30 capsule 0   Calcium Carb-Cholecalciferol (CALCIUM PLUS D3 ABSORBABLE PO) Take by mouth.     diclofenac Sodium (VOLTAREN) 1 % GEL Apply 4 g topically 4 (four) times daily. To affected joint. 300 g 11   fluticasone (FLONASE) 50 MCG/ACT nasal spray PLACE 1 SPRAY INTO  BOTH NOSTRILS DAILY 48 mL 0   Fluticasone-Salmeterol (ADVAIR) 100-50 MCG/DOSE AEPB INHALE ONE PUFF BY MOUTH TWICE A DAY 60 each 11   levocetirizine (XYZAL) 5 MG tablet Take 1 tablet (5 mg total) by mouth daily. 90 tablet 3   Multiple Vitamins-Minerals (WOMENS MULTIVITAMIN PO) Take by mouth.     omeprazole (PRILOSEC) 40 MG capsule Take 1 capsule (40 mg total) by mouth daily. 90 capsule 3   XIIDRA 5 % SOLN Place 1 drop into both eyes 2 (two) times daily.  10   nirmatrelvir/ritonavir EUA (PAXLOVID) TABS Take 3 tablets by mouth 2 (two) times daily for 5 days. (Take nirmatrelvir 150 mg two tablets twice daily for 5 days and ritonavir 100 mg one tablet twice daily for 5 days) Patient GFR is 64 30 tablet 0   No current facility-administered medications for this visit.   Allergies  Allergen Reactions   Pneumococcal Polysaccharide Vaccine Other (See Comments)    muscle spasms and cramping   Other    Pertussis Vaccines Other (See Comments)    Fever, injection site reaction     If phone visit, billing and coding can please add appropriate modifier if needed

## 2020-11-15 NOTE — Progress Notes (Signed)
Tested + this morning for covid. She has been travelling she stated that she started feeling weird she began coughing last night headache, more BM's not diarrhea.     She has only been taking tylenol, zinc, vitamin c.

## 2020-11-15 NOTE — Telephone Encounter (Signed)
Patient left a vm msg stating she tested positive for Covid. Pt states she is coughing and has low grade fever. Patient is requesting antiviral medications/benzonatate. Pls contact the patient to schedule a virtual appointment. Thanks in advance.

## 2020-11-15 NOTE — Patient Instructions (Addendum)
[email or web address]  COVID-19: Quarantine and Isolation Quarantine If you were exposed Quarantine and stay away from others when you have been in close contact with someone whohas COVID-19. Isolate If you are sick or test positive Isolate when you are sick or when you have COVID-19, even if you don't have symptoms. When to stay home Calculating quarantine The date of your exposure is considered day 0. Day 1 is the first full day after your last contact with a person who has had COVID-19. Stay home and away from other people for at least 5 days. Learn why CDC updated guidance for the general public. IF YOU were exposed to COVID-19 and are NOT up-to-date IF YOU were exposed to COVID-19 and are NOT on COVID-19 vaccinations Quarantine for at least 5 days Stay home Stay home and quarantine for at least 5 full days. Wear a well-fitted mask if you must be around others in your home. Do not travel. Get tested Even if you don't develop symptoms, get tested at least 5 days after you last had close contact with someone with COVID-19. After quarantine Watch for symptoms Watch for symptoms until 10 days after you last had close contact with someone with COVID-19. Avoid travel It is best to avoid travel until a full 10 days after you last had close contact with someone with COVID-19. If you develop symptoms Isolate immediately and get tested. Continue to stay home until you know the results. Wear a well-fitted mask around others. Take precautions until day 10 Wear a mask Wear a well-fitted mask for 10 full days any time you are around others inside your home or in public. Do not go to places where you are unable to wear a mask. If you must travel during days 6-10, take precautions. Avoid being around people who are at high risk IF YOU were exposed to COVID-19 and are up-to-date IF YOU were exposed to COVID-19 and are on COVID-19 vaccinations No quarantine You do not need  to stay home unless you develop symptoms. Get tested Even if you don't develop symptoms, get tested at least 5 days after you last had close contact with someone with COVID-19. Watch for symptoms Watch for symptoms until 10 days after you last had close contact with someone with COVID-19. If you develop symptoms Isolate immediately and get tested. Continue to stay home until you know the results. Wear a well-fitted mask around others. Take precautions until day 10 Wear a mask Wear a well-fitted mask for 10 full days any time you are around others inside your home or in public. Do not go to places where you are unable to wear a mask. Take precautions if traveling Avoid being around people who are at high risk IF YOU were exposed to COVID-19 and had confirmed COVID-19 within the past 90 days (you tested positive using a viral test) No quarantine You do not need to stay home unless you develop symptoms. Watch for symptoms Watch for symptoms until 10 days after you last had close contact with someone with COVID-19. If you develop symptoms Isolate immediately and get tested. Continue to stay home until you know the results. Wear a well-fitted mask around others. Take precautions until day 10 Wear a mask Wear a well-fitted mask for 10 full days any time you are around others inside your home or in public. Do not go to places where you are unable to wear a mask. Take precautions if traveling Avoid being around people who are at high risk  Calculating isolation Day 0 is your first day of symptoms or a positive viral test. Day 1 is the first full day after your symptoms developed or your test specimen was collected. If you have COVID-19 or have symptoms, isolate for at least 5 days. IF YOU tested positive for COVID-19 or have symptoms, regardless of vaccination status Stay home for at least 5 days Stay home for 5 days and isolate from others in your home. Wear a well-fitted mask if you must be  around others in your home. Do not travel. Ending isolation if you had symptoms End isolation after 5 full days if you are fever-free for 24 hours (without the use of fever-reducing medication) and your symptoms are improving. Ending isolation if you did NOT have symptoms End isolation after at least 5 full days after your positive test. If you were severely ill with COVID-19 or are immunocompromised You should isolate for at least 10 days. Consult your doctor before ending isolation. Take precautions until day 10 Wear a mask Wear a well-fitted mask for 10 full days any time you are around others inside your home or in public. Do not go to places where you are unable to wear a mask. Do not travel Do not travel until a full 10 days after your symptoms started or the date your positive test was taken if you had no symptoms. Avoid being around people who are at high risk Definitions Exposure Contact with someone infected with SARS-CoV-2, the virus that causes COVID-19,in a way that increases the likelihood of getting infected with the virus. Close contact A close contact is someone who was less than 6 feet away from an infected person (laboratory-confirmed or a clinical diagnosis) for a cumulative total of 15 minutes or more over a 24-hour period. For example, three individual 5-minute exposures for a total of 15 minutes. People who are exposed to someone with COVID-19 after they completed at least 5 days of isolation are notconsidered close contacts. Julio Sicks is a strategy used to prevent transmission of COVID-19 by keeping people who have been in close contact with someone with COVID-19 apart from others. Who does not need to quarantine? If you had close contact with someone with COVID-19 and you are in one of the following groups, you do not need to quarantine. You are up to date with your COVID-19 vaccines. You had confirmed COVID-19 within the last 90 days (meaning you tested  positive using a viral test). You should wear a well-fitting mask around others for 10 days from the date of your last close contact with someone with COVID-19 (the date of last close contact is considered day 0). Get tested at least 5 days after you last had close contact with someone with COVID-19. If you test positive or develop COVID-19 symptoms, isolate from other people and follow recommendations in the Isolation section below. If you tested positive for COVID-19 with a viral test within the previous 90 days and subsequently recovered and remain without COVID-19 symptoms, you do not need to quarantine or get tested after close contact. You should wear a well-fitting mask around others for 10 days from the date of your last close contact withsomeone with COVID-19 (the date of last close contact is considered day 0). Who should quarantine? If you come into close contact with someone with COVID-19, you should quarantine if you are not up to date on COVID-19 vaccines. This includes people who are not vaccinated. What to do for quarantine Stay home  and away from other people for at least 5 days (day 0 through day 5) after your last contact with a person who has COVID-19. The date of your exposure is considered day 0. Wear a well-fitting mask when around others at home, if possible. For 10 days after your last close contact with someone with COVID-19, watch for fever (100.37F or greater), cough, shortness of breath, or other COVID-19 symptoms. If you develop symptoms, get tested immediately and isolate until you receive your test results. If you test positive, follow isolation recommendations. If you do not develop symptoms, get tested at least 5 days after you last had close contact with someone with COVID-19. If you test negative, you can leave your home, but continue to wear a well-fitting mask when around others at home and in public until 10 days after your last close contact with someone with  COVID-19. If you test positive, you should isolate for at least 5 days from the date of your positive test (if you do not have symptoms). If you do develop COVID-19 symptoms, isolate for at least 5 days from the date your symptoms began (the date the symptoms started is day 0). Follow recommendations in the isolation section below. If you are unable to get a test 5 days after last close contact with someone with COVID-19, you can leave your home after day 5 if you have been without COVID-19 symptoms throughout the 5-day period. Wear a well-fitting mask for 10 days after your date of last close contact when around others at home and in public. Avoid people who are immunocompromised or at high risk for severe disease, and nursing homes and other high-risk settings, until after at least 10 days. If possible, stay away from people you live with, especially people who are at higher risk for getting very sick from COVID-19, as well as others outside your home throughout the full 10 days after your last close contact with someone with COVID-19. If you are unable to quarantine, you should wear a well-fitting mask for 10 days when around others at home and in public. If you are unable to wear a mask when around others, you should continue to quarantine for 10 days. Avoid people who are immunocompromised or at high risk for severe disease, and nursing homes and other high-risk settings, until after at least 10 days. See additional information about travel. Do not go to places where you are unable to wear a mask, such as restaurants and some gyms, and avoid eating around others at home and at work until after 10 days after your last close contact with someone with COVID-19. After quarantine Watch for symptoms until 10 days after your last close contact with someone with COVID-19. If you have symptoms, isolate immediately and get tested. Quarantine in high-risk congregate settings In certain congregate settings  that have high risk of secondary transmission (such as Systems analyst and detention facilities, homeless shelters, or cruise ships), CDC recommends a 10-day quarantine for residents, regardless of vaccination and booster status. During periods of critical staffing shortages, facilities may consider shortening the quarantine period for staff to ensure continuity of operations. Decisions to shorten quarantine in these settings should be made in consultation with state, local, tribal, or territorial health departments and should take into consideration the context and characteristics of the facility. CDC's setting-specific guidance provides additional recommendations for these settings. Isolation Isolation is used to separate people with confirmed or suspected COVID-19 from those without COVID-19. People who are in isolation  should stay home until it's safe for them to be around others. At home, anyone sick or infected should separate from others, or wear a well-fitting mask when they need to be around others. People in isolation should stay in a specific "sick room" or area and use a separate bathroom if available. Everyone who has presumed or confirmed COVID-19 should stay home and isolate from other people for at least 5 full days (day 0 is the first day of symptoms or the date of the day of the positive viral test for asymptomatic persons). They should wear a mask when around others at home and in public for an additional 5 days. People who are confirmed to have COVID-19 or are showing symptoms of COVID-19 need to isolate regardless of their vaccination status. This includes: People who have a positive viral test for COVID-19, regardless of whether or not they have symptoms. People with symptoms of COVID-19, including people who are awaiting test results or have not been tested. People with symptoms should isolate even if they do not know if they have been in close contact with someone with COVID-19. What to do  for isolation Monitor your symptoms. If you have an emergency warning sign (including trouble breathing), seek emergency medical care immediately. Stay in a separate room from other household members, if possible. Use a separate bathroom, if possible. Take steps to improve ventilation at home, if possible. Avoid contact with other members of the household and pets. Don't share personal household items, like cups, towels, and utensils. Wear a well-fitting mask when you need to be around other people. Learn more about what to do if you are sick and how to notify your contacts. Ending isolation for people who had COVID-19 and had symptoms If you had COVID-19 and had symptoms, isolate for at least 5 days. To calculate your 5-day isolation period, day 0 is your first day of symptoms. Day 1 is the first full day after your symptoms developed. You can leave isolation after 5 full days. You can end isolation after 5 full days if you are fever-free for 24 hours without the use of fever-reducing medication and your other symptoms have improved (Loss of taste and smell may persist for weeks or months after recovery and need not delay the end of isolation). You should continue to wear a well-fitting mask around others at home and in public for 5 additional days (day 6 through day 10) after the end of your 5-day isolation period. If you are unable to wear a mask when around others, you should continue to isolate for a full 10 days. Avoid people who are immunocompromised or at high risk for severe disease, and nursing homes and other high-risk settings, until after at least 10 days. If you continue to have fever or your other symptoms have not improved after 5 days of isolation, you should wait to end your isolation until you are fever-free for 24 hours without the use of fever-reducing medication and your other symptoms have improved. Continue to wear a well-fitting mask. Contact your healthcare provider if you have  questions. See additional information about travel. Do not go to places where you are unable to wear a mask, such as restaurants and some gyms, and avoid eating around others at home and at work until a full 10 days after your first day of symptoms. If an individual has access to a test and wants to test, the best approach is to use an antigen test1 towards the  end of the 5-day isolation period. Collect the test sample only if you are fever-free for 24 hours without the use of fever-reducing medication and your other symptoms have improved (loss of taste and smell may persist for weeks or months after recovery and need not delay the end of isolation). If your test result is positive, you should continue to isolate until day 10. If your test result is negative, you can end isolation, but continue to wear a well-fitting mask around others at home and in public until day 10. Follow additional recommendations for masking and avoiding travel as described above. 1As noted in the labeling for authorized over-the counter antigen tests: Negative results should be treated as presumptive. Negative results do not rule out SARS-CoV-2 infection and should not be used as the sole basis for treatment or patient management decisions, including infection control decisions. To improve results, antigen tests should be used twice over a three-day period with at least 24 hours and no more than 48 hours between tests. Note that these recommendations on ending isolation do not apply to people with moderate or severe COVID-19 or with weakened immune systems (immunocompromised). See section below for recommendations for when toend isolation for these groups. Ending isolation for people who tested positive for COVID-19 but had no symptoms If you test positive for COVID-19 and never develop symptoms, isolate for at least 5 days. Day 0 is the day of your positive viral test (based on the date you were tested) and day 1 is the first full  day after the specimen was collected for your positive test. You can leave isolation after 5 full days. If you continue to have no symptoms, you can end isolation after at least 5 days. You should continue to wear a well-fitting mask around others at home and in public until day 10 (day 6 through day 10). If you are unable to wear a mask when around others, you should continue to isolate for 10 days. Avoid people who are immunocompromised or at high risk for severe disease, and nursing homes and other high-risk settings, until after at least 10 days. If you develop symptoms after testing positive, your 5-day isolation period should start over. Day 0 is your first day of symptoms. Follow the recommendations above for ending isolation for people who had COVID-19 and had symptoms. See additional information about travel. Do not go to places where you are unable to wear a mask, such as restaurants and some gyms, and avoid eating around others at home and at work until 10 days after the day of your positive test. If an individual has access to a test and wants to test, the best approach is to use an antigen test1 towards the end of the 5-day isolation period. If your test result is positive, you should continue to isolate until day 10. If your test result is negative, you can end isolation, but continue to wear a well-fitting mask around others at home and in public until day 10. Follow additionalrecommendations for masking and avoiding travel as described above. 1As noted in the labeling for authorized over-the counter antigen tests: Negative results should be treated as presumptive. Negative results do not rule out SARS-CoV-2 infection and should not be used as the sole basis for treatment or patient management decisions, including infection control decisions. To improve results, antigen tests should be used twice over a three-day period with at least 24 hours and no more than 48 hours between tests. Ending  isolation for  people who were severely ill with COVID-19 or have a weakened immune system (immunocompromised) People who are severely ill with COVID-19 (including those who were hospitalized or required intensive care or ventilation support) and people with compromised immune systems might need to isolate at home longer. They may also require testing with a viral test to determine when they can be around others. CDC recommends an isolation period of at least 10 and up to 20 days for people who were severely ill with COVID-19 and for people with weakened immune systems. Consult with your healthcare provider about when you can resume being aroundother people. People who are immunocompromised should talk to their healthcare provider about the potential for reduced immune responses to COVID-19 vaccines and the need to continue to follow current prevention measures (including wearing a well-fitting mask, staying 6 feet apart from others they don't live with, and avoiding crowds and poorly ventilated indoor spaces) to protect themselves against COVID-19 until advised otherwise by their healthcare provider. Close contacts of immunocompromised people--including household members--should also be encouraged to receive all recommended COVID-19 vaccine doses to help protect these people. Isolation in high-risk congregate settings In certain high-risk congregate settings that have high risk of secondary transmission and where it is not feasible to cohort people (such as Systems analyst and detention facilities, homeless shelters, and cruise ships), CDC recommends a 10-day isolation period for residents. During periods of critical staffing shortages, facilities may consider shortening the isolation period for staff to ensure continuity of operations. Decisions to shorten isolation in these settings should be made in consultation with state, local, tribal, or territorial health departments and should take into consideration the  context and characteristics of the facility. CDC's setting-specific guidance provides additional recommendations for these settings. This CDC guidance is meant to supplement--not replace--any federal, state, local,territorial, or tribal health and safety laws, rules, and regulations. Recommendations for specific settings These recommendations do not apply to healthcare professionals. For guidance specific to these settings, see Healthcare professionals: Interim Guidance for Optician, dispensing with SARS-CoV-2 Infection or Exposure to SARS-CoV-2 Patients, residents, and visitors to healthcare settings: Interim Infection Prevention and Control Recommendations for Healthcare Personnel During the [place] 2019 (COVID-19) Pandemic Additional setting-specific guidance and recommendations are available. These recommendations on quarantine and isolation do apply to [place] settings. Additional guidance is available here: Overview of COVID-19 Quarantine for K-12 Schools Travelers: Travel information and recommendations Congregate facilities and other settings: [employer] for community, work, and school settings Ongoing COVID-19 exposure FAQs I live with someone with COVID-19, but I cannot be separated from them. How do we manage quarantine in this situation? It is very important for people with COVID-19 to remain apart from other people, if possible, even if they are living together. If separation of the person with COVID-19 from others that they live with is not possible, the other people that they live with will have ongoing exposure, meaning they will be repeatedly exposed until that person is no longer able to spread the virus to other people. In this situation, there are precautions you can take to limit the spread of COVID-19: The person with COVID-19 and everyone they live with should wear a well-fitting mask inside the home. If possible, one person should care for the person  with COVID-19 to limit the number of people who are in close contact with the infected person. Take steps to protect yourself and others to reduce transmission in the home: Quarantine if you are not up to date with  your COVID-19 vaccines. Isolate if you are sick or tested positive for COVID-19, even if you don't have symptoms. Learn more about the public health recommendations for testing, mask use and quarantine of close contacts, like yourself, who have ongoing exposure. These recommendations differ depending on your vaccination status. What should I do if I have ongoing exposure to COVID-19 from someone I live with? Recommendations for this situation depend on your vaccination status: If you are not up to date on COVID-19 vaccines and have ongoing exposure to COVID-19, you should: Begin quarantine immediately and continue to quarantine throughout the isolation period of the person with COVID-19. Continue to quarantine for an additional 5 days starting the day after the end of isolation for the person with COVID-19. Get tested at least 5 days after the end of isolation of the infected person that lives with them. If you test negative, you can leave the home but should continue to wear a well-fitting mask when around others at home and in public until 10 days after the end of isolation for the person with COVID-19. Isolate immediately if you develop symptoms of COVID-19 or test positive. If you are up to date with COVID-19 vaccines and have ongoing exposure to COVID-19, you should: Get tested at least 5 days after your first exposure. A person with COVID-19 is considered infectious starting 2 days before they develop symptoms, or 2 days before the date of their positive test if they do not have symptoms. Get tested again at least 5 days after the end of isolation for the person with COVID-19. Wear a well-fitting mask when you are around the person with COVID-19, and do this throughout their  isolation period. Wear a well-fitting mask around others for 10 days after the infected person's isolation period ends. Isolate immediately if you develop symptoms of COVID-19 or test positive. What should I do if multiple people I live with test positive for COVID-19 at different times? Recommendations for this situation depend on your vaccination status: If you are not up to date with your COVID-19 vaccines, you should: Quarantine throughout the isolation period of any infected person that you live with. Continue to quarantine until 5 days after the end of isolation date for the most recently infected person that lives with you. For example, if the last day of isolation of the person most recently infected with COVID-19 was June 30, the new 5-day quarantine period starts on July 1. Get tested at least 5 days after the end of isolation for the most recently infected person that lives with you. Wear a well-fitting mask when you are around any person with COVID-19 while that person is in isolation. Wear a well-fitting mask when you are around other people until 10 days after your last close contact. Isolate immediately if you develop symptoms of COVID-19 or test positive. If you are up to date with COVID-19 your vaccines , you should: Get tested at least 5 days after your first exposure. A person with COVID-19 is considered infectious starting 2 days before they developed symptoms, or 2 days before the date of their positive test if they do not have symptoms. Get tested again at least 5 days after the end of isolation for the most recently infected person that lives with you. Wear a well-fitting mask when you are around any person with COVID-19 while that person is in isolation. Wear a well-fitting mask around others for 10 days after the end of isolation for the most recently infected  person that lives with you. For example, if the last day of isolation for the person most recently infected with  COVID-19 was June 30, the new 10-day period to wear a well-fitting mask indoors in public starts on July 1. Isolate immediately if you develop symptoms of COVID-19 or test positive. I had COVID-19 and completed isolation. Do I have to quarantine or get tested if someone I live with gets COVID-19 shortly after I completed isolation? No. If you recently completed isolation and someone that lives with you tests positive for the virus that causes COVID-19 shortly after the end of your isolation period, you do not have to quarantine or get tested as long as you do not develop new symptoms. Once all of the people that live together have completed isolation or quarantine, refer to the guidance below for new exposures to COVID-19. If you had COVID-19 in the previous 90 days and then came into close contact with someone with COVID-19, you do not have to quarantine or get tested if you do not have symptoms. But you should: Wear a well-fitting mask indoors in public for 10 days after exposure. Monitor for COVID-19 symptoms and isolate immediately if symptoms develop. Consult with a healthcare provider for testing recommendations if new symptoms develop. If more than 90 days have passed since your recovery from infection, follow CDC's recommendations for close contacts. These recommendations will differ depending on your vaccination status. 06/28/2020 Content source: [facility] ([facility]), Division of Viral Diseases This information is not intended to replace advice given to you by your health care provider. Make sure you discuss any questions you have with your healthcare provider. Document Revised: 07/06/2020 Document Reviewed: 07/06/2020 Elsevier Patient Education  [place].

## 2020-11-30 ENCOUNTER — Telehealth: Payer: Self-pay

## 2020-11-30 ENCOUNTER — Other Ambulatory Visit: Payer: Self-pay | Admitting: Osteopathic Medicine

## 2020-11-30 NOTE — Telephone Encounter (Signed)
Error

## 2020-12-05 DIAGNOSIS — Z4689 Encounter for fitting and adjustment of other specified devices: Secondary | ICD-10-CM | POA: Diagnosis not present

## 2020-12-05 DIAGNOSIS — N819 Female genital prolapse, unspecified: Secondary | ICD-10-CM | POA: Diagnosis not present

## 2020-12-05 DIAGNOSIS — N952 Postmenopausal atrophic vaginitis: Secondary | ICD-10-CM | POA: Diagnosis not present

## 2020-12-05 DIAGNOSIS — Z90710 Acquired absence of both cervix and uterus: Secondary | ICD-10-CM | POA: Diagnosis not present

## 2020-12-05 DIAGNOSIS — N765 Ulceration of vagina: Secondary | ICD-10-CM | POA: Diagnosis not present

## 2020-12-05 DIAGNOSIS — N950 Postmenopausal bleeding: Secondary | ICD-10-CM | POA: Diagnosis not present

## 2020-12-25 DIAGNOSIS — N819 Female genital prolapse, unspecified: Secondary | ICD-10-CM | POA: Diagnosis not present

## 2020-12-25 DIAGNOSIS — Z4689 Encounter for fitting and adjustment of other specified devices: Secondary | ICD-10-CM | POA: Diagnosis not present

## 2020-12-25 DIAGNOSIS — N765 Ulceration of vagina: Secondary | ICD-10-CM | POA: Diagnosis not present

## 2021-01-16 DIAGNOSIS — Z20828 Contact with and (suspected) exposure to other viral communicable diseases: Secondary | ICD-10-CM | POA: Diagnosis not present

## 2021-01-22 DIAGNOSIS — K219 Gastro-esophageal reflux disease without esophagitis: Secondary | ICD-10-CM | POA: Diagnosis not present

## 2021-01-22 DIAGNOSIS — E782 Mixed hyperlipidemia: Secondary | ICD-10-CM | POA: Diagnosis not present

## 2021-01-22 DIAGNOSIS — E7849 Other hyperlipidemia: Secondary | ICD-10-CM | POA: Diagnosis not present

## 2021-01-22 DIAGNOSIS — Z20828 Contact with and (suspected) exposure to other viral communicable diseases: Secondary | ICD-10-CM | POA: Diagnosis not present

## 2021-01-22 DIAGNOSIS — J45998 Other asthma: Secondary | ICD-10-CM | POA: Diagnosis not present

## 2021-01-22 DIAGNOSIS — Z0000 Encounter for general adult medical examination without abnormal findings: Secondary | ICD-10-CM | POA: Diagnosis not present

## 2021-01-23 DIAGNOSIS — N819 Female genital prolapse, unspecified: Secondary | ICD-10-CM | POA: Diagnosis not present

## 2021-01-23 DIAGNOSIS — N814 Uterovaginal prolapse, unspecified: Secondary | ICD-10-CM | POA: Diagnosis not present

## 2021-01-23 DIAGNOSIS — Z4689 Encounter for fitting and adjustment of other specified devices: Secondary | ICD-10-CM | POA: Diagnosis not present

## 2021-01-23 DIAGNOSIS — N816 Rectocele: Secondary | ICD-10-CM | POA: Diagnosis not present

## 2021-01-29 DIAGNOSIS — Z85828 Personal history of other malignant neoplasm of skin: Secondary | ICD-10-CM | POA: Diagnosis not present

## 2021-01-29 DIAGNOSIS — D1801 Hemangioma of skin and subcutaneous tissue: Secondary | ICD-10-CM | POA: Diagnosis not present

## 2021-01-29 DIAGNOSIS — L821 Other seborrheic keratosis: Secondary | ICD-10-CM | POA: Diagnosis not present

## 2021-01-29 DIAGNOSIS — L814 Other melanin hyperpigmentation: Secondary | ICD-10-CM | POA: Diagnosis not present

## 2021-01-29 DIAGNOSIS — L570 Actinic keratosis: Secondary | ICD-10-CM | POA: Diagnosis not present

## 2021-01-31 ENCOUNTER — Encounter: Payer: Medicare PPO | Admitting: Osteopathic Medicine

## 2021-01-31 DIAGNOSIS — Z20828 Contact with and (suspected) exposure to other viral communicable diseases: Secondary | ICD-10-CM | POA: Diagnosis not present

## 2021-04-17 DIAGNOSIS — N8111 Cystocele, midline: Secondary | ICD-10-CM | POA: Diagnosis not present

## 2021-04-17 DIAGNOSIS — Z4689 Encounter for fitting and adjustment of other specified devices: Secondary | ICD-10-CM | POA: Diagnosis not present

## 2021-04-17 DIAGNOSIS — N816 Rectocele: Secondary | ICD-10-CM | POA: Diagnosis not present

## 2021-04-17 DIAGNOSIS — N819 Female genital prolapse, unspecified: Secondary | ICD-10-CM | POA: Diagnosis not present

## 2021-05-15 DIAGNOSIS — M152 Bouchard's nodes (with arthropathy): Secondary | ICD-10-CM | POA: Diagnosis not present

## 2021-05-15 DIAGNOSIS — M13849 Other specified arthritis, unspecified hand: Secondary | ICD-10-CM | POA: Diagnosis not present

## 2021-05-15 DIAGNOSIS — M79644 Pain in right finger(s): Secondary | ICD-10-CM | POA: Diagnosis not present

## 2021-05-15 DIAGNOSIS — M65841 Other synovitis and tenosynovitis, right hand: Secondary | ICD-10-CM | POA: Diagnosis not present

## 2021-06-18 DIAGNOSIS — N819 Female genital prolapse, unspecified: Secondary | ICD-10-CM | POA: Diagnosis not present

## 2021-06-18 DIAGNOSIS — N814 Uterovaginal prolapse, unspecified: Secondary | ICD-10-CM | POA: Diagnosis not present

## 2021-06-18 DIAGNOSIS — Z4689 Encounter for fitting and adjustment of other specified devices: Secondary | ICD-10-CM | POA: Diagnosis not present

## 2021-06-18 DIAGNOSIS — N816 Rectocele: Secondary | ICD-10-CM | POA: Diagnosis not present

## 2021-06-18 DIAGNOSIS — N765 Ulceration of vagina: Secondary | ICD-10-CM | POA: Diagnosis not present

## 2021-06-21 ENCOUNTER — Other Ambulatory Visit: Payer: Self-pay

## 2021-06-21 ENCOUNTER — Ambulatory Visit: Payer: Medicare PPO | Admitting: Gastroenterology

## 2021-06-21 ENCOUNTER — Encounter: Payer: Self-pay | Admitting: Gastroenterology

## 2021-06-21 VITALS — BP 140/82 | HR 85 | Ht 66.0 in | Wt 179.5 lb

## 2021-06-21 DIAGNOSIS — K5730 Diverticulosis of large intestine without perforation or abscess without bleeding: Secondary | ICD-10-CM

## 2021-06-21 DIAGNOSIS — Q438 Other specified congenital malformations of intestine: Secondary | ICD-10-CM

## 2021-06-21 DIAGNOSIS — Z86010 Personal history of colonic polyps: Secondary | ICD-10-CM | POA: Diagnosis not present

## 2021-06-21 DIAGNOSIS — K219 Gastro-esophageal reflux disease without esophagitis: Secondary | ICD-10-CM

## 2021-06-21 MED ORDER — CLENPIQ 10-3.5-12 MG-GM -GM/160ML PO SOLN: 1.0000 | ORAL | 0 refills | Status: DC

## 2021-06-21 NOTE — Patient Instructions (Signed)
If you are age 77 or older, your body mass index should be between 23-30. Your Body mass index is 28.97 kg/m. If this is out of the aforementioned range listed, please consider follow up with your Primary Care Provider.  If you are age 58 or younger, your body mass index should be between 19-25. Your Body mass index is 28.97 kg/m. If this is out of the aformentioned range listed, please consider follow up with your Primary Care Provider.   __________________________________________________________  The [facility] providers would like to encourage you to use [facility] to communicate with providers for non-urgent requests or questions.  Due to long hold times on the telephone, sending your provider a message by [facility] may be a faster and more efficient way to get a response.  Please allow 48 business hours for a response.  Please remember that this is for non-urgent requests.   Due to recent changes in healthcare laws, you may see the results of your imaging and laboratory studies on MyChart before your provider has had a chance to review them.  We understand that in some cases there may be results that are confusing or concerning to you. Not all laboratory results come back in the same time frame and the provider may be waiting for multiple results in order to interpret others.  Please give [place] 48 hours in order for your provider to thoroughly review all the results before contacting the office for clarification of your results.   We have sent the following medications to your pharmacy for you to pick up at your convenience:  Clenpiq for colonoscopy  Thank you for choosing me and [facility].  Vito Cirigliano, D.O.

## 2021-06-21 NOTE — Progress Notes (Signed)
Chief Complaint:    Colon cancer screening/colon polyp surveillance, discuss colonoscopy  GI History: 76 year old female with asthma, GERD, pelvic prolapse, follows in the GI clinic for the following:  Longstanding history of GERD.  Well-controlled with omeprazole 40 mg/day.  No dysphagia.  EGD approximately 2005.  No records for review.  History of colon polyps with last colonoscopy in 11/2018 notable for 2 SSP's, measuring 8-11 mm.  History of significantly tortuous colon as outlined below.  Has pelvic pessary in place for prolapse that needs to be removed prior to colonoscopies.  Endoscopic History: - Flexible sigmoidoscopy (2001): Normal per patient - Colonoscopy (2011): Incomplete procedure due to tortuosity and redundancy - Air-contrast barium enema (2011): "Extremely redundant":, Sigmoid diverticulosis, some retained stool but no masses or large polyps - Colonoscopy (11/2018): 2 flat polyps in transverse colon measuring 8-11 mm (path: SSP's), 4 mm rectal polyp (path: HP), Diverticulosis.  Significantly tortuous colon.  Abdominal binder placed at start of procedure and advanced colonoscope using abdominal pressure and water irrigation.  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  Normal TI.  Recommended repeat in 3 years.  HPI:     Patient is a 76 y.o. female presenting to the Gastroenterology Clinic for follow-up.  Initially seen by me in 09/2018 for evaluation of positive Cologuard.  Colonoscopy with 2 SSP's measuring 8-11 mm.  Significant tortuosity with prolonged procedure (50+ minutes) despite abdominal binder in place prior to procedure, abdominal pressure, and water irrigation.    She presents today to discuss repeat colonoscopy for ongoing polyp surveillance.  She is otherwise without any GI symptoms.  Rare constipation which she treats with mineral oil.  No laxatives or stool softeners.  Pelvic pessary (for prolapse) was removed on 06/18/2021, and needs colonoscopy  scheduled prior to 2/9 when pessary gets re-placed again.   No recent abdominal imaging for review today.     Review of systems:     No chest pain, no SOB, no fevers, no urinary sx   Past Medical History:  Diagnosis Date   Acid reflux    Allergy    Asthma    Environmental and seasonal allergies    History of basal cell carcinoma    skin cancer   History of squamous cell carcinoma    Osteopenia after menopause    Pelvic prolapse    Positive colorectal cancer screening using Cologuard test 08/06/2018   Referred 08/06/18 to GI    Patient's surgical history, family medical history, social history, medications and allergies were all reviewed in Epic    Current Outpatient Medications  Medication Sig Dispense Refill   albuterol (VENTOLIN HFA) 108 (90 Base) MCG/ACT inhaler Inhale 1-2 puffs into the lungs every 4 (four) hours as needed for wheezing or shortness of breath. 18 g 99   Calcium Carb-Cholecalciferol (CALCIUM PLUS D3 ABSORBABLE PO) Take by mouth.     diclofenac Sodium (VOLTAREN) 1 % GEL Apply 4 g topically 4 (four) times daily. To affected joint. 300 g 11   Fluticasone-Salmeterol (ADVAIR) 100-50 MCG/DOSE AEPB INHALE ONE PUFF BY MOUTH TWICE A DAY 60 each 11   levocetirizine (XYZAL) 5 MG tablet Take 1 tablet (5 mg total) by mouth daily. 90 tablet 3   Multiple Vitamins-Minerals (WOMENS MULTIVITAMIN PO) Take by mouth.     omeprazole (PRILOSEC) 40 MG capsule Take 1 capsule (40 mg total) by mouth daily. 90 capsule 3   XIIDRA 5 % SOLN Place 1 drop into both eyes 2 (two) times daily.  10   No current facility-administered medications for this visit.    Physical Exam:     BP 140/82    Pulse 85    Ht 5\' 6"  (1.676 m)    Wt 179 lb 8 oz (81.4 kg)    SpO2 98%    BMI 28.97 kg/m   GENERAL:  Pleasant female in NAD PSYCH: : Cooperative, normal affect EENT:  conjunctiva pink, mucous membranes moist, neck supple without masses CARDIAC:  RRR, no murmur heard, no peripheral edema PULM:  Normal respiratory effort, lungs CTA bilaterally, no wheezing ABDOMEN:  Nondistended, soft, nontender. No obvious masses, no hepatomegaly,  normal bowel sounds SKIN:  turgor, no lesions seen Musculoskeletal:  Normal muscle tone, normal strength NEURO: Alert and oriented x 3, no focal neurologic deficits   IMPRESSION and PLAN:    1) History of colon polyps 2) Diverticulosis 3) Tortuous colon  - Due for repeat colonoscopy for ongoing polyp surveillance - Schedule colonoscopy to be done before 07/11/2021 - For colonoscopy, plan for abdominal binder to be placed and preoperative area along with intraoperative plan for water immersion and abdominal pressure at the start of procedure -If colonoscopy incomplete, discussed backup plan of virtual colonoscopy  4) GERD - Well-controlled on current therapy - Continue antireflux lifestyle/dietary modifications     The indications, risks, and benefits of colonoscopy were explained to the patient in detail. Risks include but are not limited to bleeding, perforation, adverse reaction to medications, and cardiopulmonary compromise. Sequelae include but are not limited to the possibility of surgery, hospitalization, and mortality. The patient verbalized understanding and wished to proceed. All questions answered, referred to the scheduler and bowel prep ordered. Further recommendations pending results of the exam.    Lavena Bullion ,DO, FACG 06/21/2021, 2:44 PM

## 2021-07-07 ENCOUNTER — Encounter: Payer: Self-pay | Admitting: Certified Registered Nurse Anesthetist

## 2021-07-08 ENCOUNTER — Ambulatory Visit (AMBULATORY_SURGERY_CENTER): Payer: Medicare PPO | Admitting: Gastroenterology

## 2021-07-08 ENCOUNTER — Encounter: Payer: Self-pay | Admitting: Gastroenterology

## 2021-07-08 VITALS — BP 118/72 | HR 65 | Temp 96.6°F | Resp 12 | Ht 66.0 in | Wt 179.0 lb

## 2021-07-08 DIAGNOSIS — D124 Benign neoplasm of descending colon: Secondary | ICD-10-CM | POA: Diagnosis not present

## 2021-07-08 DIAGNOSIS — D125 Benign neoplasm of sigmoid colon: Secondary | ICD-10-CM | POA: Diagnosis not present

## 2021-07-08 DIAGNOSIS — Z86010 Personal history of colonic polyps: Secondary | ICD-10-CM

## 2021-07-08 DIAGNOSIS — K5730 Diverticulosis of large intestine without perforation or abscess without bleeding: Secondary | ICD-10-CM | POA: Diagnosis not present

## 2021-07-08 DIAGNOSIS — J45909 Unspecified asthma, uncomplicated: Secondary | ICD-10-CM | POA: Diagnosis not present

## 2021-07-08 DIAGNOSIS — K635 Polyp of colon: Secondary | ICD-10-CM

## 2021-07-08 MED ORDER — SODIUM CHLORIDE 0.9 % IV SOLN: 500.0000 mL | INTRAVENOUS | Status: DC

## 2021-07-08 NOTE — Patient Instructions (Signed)
° °  Handout on polyps given to you today  Await pathology on polyps removed   YOU HAD AN ENDOSCOPIC PROCEDURE TODAY AT [facility]:   Refer to the procedure report that was given to you for any specific questions about what was found during the examination.  If the procedure report does not answer your questions, please call your gastroenterologist to clarify.  If you requested that your care partner not be given the details of your procedure findings, then the procedure report has been included in a sealed envelope for you to review at your convenience later.  YOU SHOULD EXPECT: Some feelings of bloating in the abdomen. Passage of more gas than usual.  Walking can help get rid of the air that was put into your GI tract during the procedure and reduce the bloating. If you had a lower endoscopy (such as a colonoscopy or flexible sigmoidoscopy) you may notice spotting of blood in your stool or on the toilet paper. If you underwent a bowel prep for your procedure, you may not have a normal bowel movement for a few days.  Please Note:  You might notice some irritation and congestion in your nose or some drainage.  This is from the oxygen used during your procedure.  There is no need for concern and it should clear up in a day or so.  SYMPTOMS TO REPORT IMMEDIATELY:  Following lower endoscopy (colonoscopy or flexible sigmoidoscopy):  Excessive amounts of blood in the stool  Significant tenderness or worsening of abdominal pains  Swelling of the abdomen that is new, acute  Fever of 100F or higher   For urgent or emergent issues, a gastroenterologist can be reached at any hour by calling [phone]. Do not use MyChart messaging for urgent concerns.    DIET:  We do recommend a small meal at first, but then you may proceed to your regular diet.  Drink plenty of fluids but you should avoid alcoholic beverages for 24 hours.  ACTIVITY:  You should plan to take it easy for the rest  of today and you should NOT DRIVE or use heavy machinery until tomorrow (because of the sedation medicines used during the test).    FOLLOW UP: Our staff will call the number listed on your records 48-72 hours following your procedure to check on you and address any questions or concerns that you may have regarding the information given to you following your procedure. If we do not reach you, we will leave a message.  We will attempt to reach you two times.  During this call, we will ask if you have developed any symptoms of COVID 19. If you develop any symptoms (ie: fever, flu-like symptoms, shortness of breath, cough etc.) before then, please call [phone].  If you test positive for Covid 19 in the 2 weeks post procedure, please call and report this information to [place].    If any biopsies were taken you will be contacted by phone or by letter within the next 1-3 weeks.  Please call us at [phone] if you have not heard about the biopsies in 3 weeks.    SIGNATURES/CONFIDENTIALITY: You and/or your care partner have signed paperwork which will be entered into your electronic medical record.  These signatures attest to the fact that that the information above on your After Visit Summary has been reviewed and is understood.  Full responsibility of the confidentiality of this discharge information lies with you and/or your care-partner.

## 2021-07-08 NOTE — Progress Notes (Signed)
Report given to PACU, vss 

## 2021-07-08 NOTE — Op Note (Signed)
[facility] Patient Name: Marie Grimes Procedure Date: 07/08/2021 11:10 AM MRN: [id number] Endoscopist: Gerrit Heck , MD Age: 76 Referring MD:  Date of Birth: September 08, 1945 Gender: Female Account #: [id number] Procedure:                Colonoscopy Indications:              Surveillance: Personal history of adenomatous                            polyps on last colonoscopy 3 years ago                           Last Colonoscopy was 11/2018 and notable for 2 flat                            polyps in transverse colon measuring 8-11 mm (path:                            SSP's), 4 mm rectal polyp (path: HP),                            Diverticulosis. Significantly tortuous colon. 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                            Normal TI. Recommended repeat in 3 years. Medicines:                Monitored Anesthesia Care Procedure:                Pre-Anesthesia Assessment:                           - Prior to the procedure, a History and Physical                            was performed, and patient medications and                            allergies were reviewed. The patient's tolerance of                            previous anesthesia was also reviewed. The risks                            and benefits of the procedure and the sedation                            options and risks were discussed with the patient.                            All questions were answered, and informed consent  was obtained. Prior Anticoagulants: The patient has                            taken no previous anticoagulant or antiplatelet                            agents. ASA Grade Assessment: II - A patient with                            mild systemic disease. After reviewing the risks                            and benefits, the patient was deemed in                            satisfactory condition to undergo the  procedure.                           After obtaining informed consent, the colonoscope                            was passed under direct vision. Throughout the                            procedure, the patient's blood pressure, pulse, and                            oxygen saturations were monitored continuously. The                            Olympus CF-HQ190L ([id number]) Colonoscope was                            introduced through the anus and advanced to the the                            terminal ileum. The colonoscopy was technically                            difficult and complex due to a tortuous colon.                            Successful completion of the procedure was aided by                            using manual pressure and an abdominal binder                            placed prior to the start of case. The patient                            tolerated the procedure well. The quality of the  bowel preparation was good. The terminal ileum,                            ileocecal valve, appendiceal orifice, and rectum                            were photographed. Scope In: 11:30:29 AM Scope Out: 11:53:25 AM Scope Withdrawal Time: 0 hours 14 minutes 0 seconds  Total Procedure Duration: 0 hours 22 minutes 56 seconds  Findings:                 The perianal and digital rectal examinations were                            normal.                           A 12 mm polyp was found in the descending colon.                            The polyp was flat with adherent mucus cap. The                            polyp was removed with a cold snare. Resection and                            retrieval were complete. Estimated blood loss was                            minimal.                           A 4 mm polyp was found in the proximal sigmoid                            colon. The polyp was sessile. The polyp was removed                            with a cold  snare. Resection and retrieval were                            complete. Estimated blood loss was minimal.                           Multiple small and large-mouthed diverticula were                            found in the sigmoid colon and a single diverticula                            in the ascending colon.                           The sigmoid colon was significantly tortuous.  Advancing the scope required using manual pressure,                            water irrigation, and a series of straightening and                            shortening the scope to obtain bowel loop                            reduction. Additionally, an abdominal binder was                            placed on the patient prior to the start of the                            procedure.                           The terminal ileum appeared normal.                           Retroflexion in the rectum was not performed due to                            narrowed rectal vault. Anterograde views were                            otherwise normal appearing. Complications:            No immediate complications. Estimated Blood Loss:     Estimated blood loss was minimal. Impression:               - One 12 mm polyp in the descending colon, removed                            with a cold snare. Resected and retrieved.                           - One 4 mm polyp in the proximal sigmoid colon,                            removed with a cold snare. Resected and retrieved.                           - Diverticulosis in the sigmoid colon and in the                            ascending colon.                           - Tortuous sigmoid colon.                           - The examined portion of the ileum was normal. Recommendation:           -  Patient has a contact number available for                            emergencies. The signs and symptoms of potential                            delayed  complications were discussed with the                            patient. Return to normal activities tomorrow.                            Written discharge instructions were provided to the                            patient.                           - Resume previous diet.                           - Continue present medications.                           - Await pathology results.                           - Repeat colonoscopy for surveillance based on                            pathology results.                           - Return to GI office PRN. Gerrit Heck, MD 07/08/2021 12:03:21 PM

## 2021-07-08 NOTE — Progress Notes (Signed)
GASTROENTEROLOGY PROCEDURE H&P NOTE   Primary Care Physician: [name], DO    Reason for Procedure:   Colon cancer screening/colon polyp surveillance  Plan:    Colonoscopy  Patient is appropriate for endoscopic procedure(s) in the ambulatory ([facility]) setting.  The nature of the procedure, as well as the risks, benefits, and alternatives were carefully and thoroughly reviewed with the patient. Ample time for discussion and questions allowed. The patient understood, was satisfied, and agreed to proceed.     HPI: Marie Grimes is a 76 y.o. female who presents for colonoscopy for evaluation of ongoing polyp surveillance .  Patient was most recently seen in the Gastroenterology Clinic on 06/21/2021 by me.  No interval change in medical history since that appointment. Please refer to that note for full details regarding GI history and clinical presentation.   Endoscopic History: - Flexible sigmoidoscopy (2001): Normal per patient - Colonoscopy (2011): Incomplete procedure due to tortuosity and redundancy - Air-contrast barium enema (2011): "Extremely redundant":, Sigmoid diverticulosis, some retained stool but no masses or large polyps - Colonoscopy (11/2018): 2 flat polyps in transverse colon measuring 8-11 mm (path: SSP's), 4 mm rectal polyp (path: HP), Diverticulosis.  Significantly tortuous colon.  Abdominal binder placed at start of procedure and advanced colonoscope using abdominal pressure and water irrigation.  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  Normal TI.  Recommended repeat in 3 years.  Past Medical History:  Diagnosis Date   Acid reflux    Allergy    Asthma    Environmental and seasonal allergies    History of basal cell carcinoma    skin cancer   History of squamous cell carcinoma    Osteopenia after menopause    Pelvic prolapse    Positive colorectal cancer screening using Cologuard test 08/06/2018   Referred 08/06/18 to GI    Past  Surgical History:  Procedure Laterality Date   ABDOMINAL HYSTERECTOMY     age 74, endometriosis   BREAST LUMPECTOMY Left    age 56, benign   CERVICAL CONE BIOPSY     COLONOSCOPY  2001   states "attempted" and had tortuous   ELBOW SURGERY     FRACTURE SURGERY Left 2018   wrist   REFRACTIVE SURGERY     SIGMOIDOSCOPY  2001   TONSILLECTOMY      Prior to Admission medications   Medication Sig Start Date End Date Taking? Authorizing Provider  Calcium Carb-Cholecalciferol (CALCIUM PLUS D3 ABSORBABLE PO) Take by mouth. 09/12/13  Yes [provider]  diclofenac Sodium (VOLTAREN) 1 % GEL Apply 4 g topically 4 (four) times daily. To affected joint. 01/30/20  Yes [name], DO  Fluticasone-Salmeterol (ADVAIR) 100-50 MCG/DOSE AEPB INHALE ONE PUFF BY MOUTH TWICE A DAY 09/25/20  Yes [name], DO  levocetirizine (XYZAL) 5 MG tablet Take 1 tablet (5 mg total) by mouth daily. 01/30/20  Yes [name], DO  Multiple Vitamins-Minerals (WOMENS MULTIVITAMIN PO) Take by mouth. 09/12/13  Yes [provider]  omeprazole (PRILOSEC) 40 MG capsule Take 1 capsule (40 mg total) by mouth daily. 01/30/20  Yes [name], DO  XIIDRA 5 % SOLN Place 1 drop into both eyes 2 (two) times daily. 03/09/18  Yes [provider]  albuterol (VENTOLIN HFA) 108 (90 Base) MCG/ACT inhaler Inhale 1-2 puffs into the lungs every 4 (four) hours as needed for wheezing or shortness of breath. 01/30/20   [name], DO    Current Outpatient Medications  Medication Sig Dispense  Refill   Calcium Carb-Cholecalciferol (CALCIUM PLUS D3 ABSORBABLE PO) Take by mouth.     diclofenac Sodium (VOLTAREN) 1 % GEL Apply 4 g topically 4 (four) times daily. To affected joint. 300 g 11   Fluticasone-Salmeterol (ADVAIR) 100-50 MCG/DOSE AEPB INHALE ONE PUFF BY MOUTH TWICE A DAY 60 each 11   levocetirizine (XYZAL) 5 MG tablet Take 1 tablet (5 mg total) by mouth daily. 90 tablet 3   Multiple  Vitamins-Minerals (WOMENS MULTIVITAMIN PO) Take by mouth.     omeprazole (PRILOSEC) 40 MG capsule Take 1 capsule (40 mg total) by mouth daily. 90 capsule 3   XIIDRA 5 % SOLN Place 1 drop into both eyes 2 (two) times daily.  10   albuterol (VENTOLIN HFA) 108 (90 Base) MCG/ACT inhaler Inhale 1-2 puffs into the lungs every 4 (four) hours as needed for wheezing or shortness of breath. 18 g 99   Current Facility-Administered Medications  Medication Dose Route Frequency Provider Last Rate Last Admin   0.9 %  sodium chloride infusion  500 mL Intravenous Continuous Earnest Mcgillis V, DO        Allergies as of 07/08/2021 - Review Complete 07/08/2021  Allergen Reaction Noted   Pneumococcal polysaccharide vaccine Other (See Comments) 09/27/2014   Other  07/02/2018   Pertussis vaccines Other (See Comments) 11/27/2016    Family History  Problem Relation Age of Onset   Hypertension Mother    Stroke Mother    Stroke Father    Diabetes Brother    Celiac disease Brother    Rectal cancer Cousin    Alcohol abuse Son    Colon cancer Neg Hx    Esophageal cancer Neg Hx    Stomach cancer Neg Hx    Colon polyps Neg Hx     Social History   Socioeconomic History   Marital status: Married    Spouse name: [name]   Number of children: 2   Years of education: 25   Highest education level: Professional school degree (e.g., MD, DDS, DVM, JD)  Occupational History   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  Tobacco Use   Smoking status: Never   Smokeless tobacco: Never  Vaping Use   Vaping Use: Never used  Substance and Sexual Activity   Alcohol use: Yes    Alcohol/week: 4.0 - 7.0 standard drinks    Types: 4 - 7 Glasses of wine per week   Drug use: Never   Sexual activity: Not Currently  Other Topics Concern   Not on file  Social History Narrative   Walks dogs   Does light weight lifting   Play computer games, read   Shopping   gardening   Social Determinants of  Health   Financial Resource Strain: Not on file  Food Insecurity: Not on file  Transportation Needs: Not on file  Physical Activity: Not on file  Stress: Not on file  Social Connections: Not on file  Intimate Partner Violence: Not on file    Physical Exam: Vital signs in last 24 hours: @BP  (!) 157/75    Pulse 72    Temp (!) 96.6 F (35.9 C) (Temporal)    Resp 18    Ht 5\' 6"  (1.676 m)    Wt 179 lb (81.2 kg)    SpO2 99%    BMI 28.89 kg/m  GEN: NAD EYE: Sclerae anicteric ENT: MMM CV: Non-tachycardic Pulm: CTA b/l GI: Soft, NT/ND NEURO:  Alert & Oriented x 3  Gerrit Heck, DO [facility]   07/08/2021 11:23 AM

## 2021-07-08 NOTE — Progress Notes (Signed)
Pt's states no medical or surgical changes since previsit or office visit. 

## 2021-07-08 NOTE — Progress Notes (Signed)
Called to room to assist during endoscopic procedure.  Patient ID and intended procedure confirmed with present staff. Received instructions for my participation in the procedure from the performing physician.  

## 2021-07-09 ENCOUNTER — Telehealth: Payer: Self-pay

## 2021-07-09 NOTE — Telephone Encounter (Signed)
Appointment is made with the Patient for an office visit on 07/26/20 at 1.40pm with Dr. Bryan Lemma to discuss her reflux and antireflux surgical options.

## 2021-07-09 NOTE — Telephone Encounter (Signed)
-----   Message from [place], DO sent at 07/08/2021  3:18 PM EST ----- Patient would like an appt to discuss her reflux and antireflux surgical options. Please schedule OV with me. She will also need an EGD, but that can want until OV. Thanks!

## 2021-07-10 ENCOUNTER — Telehealth: Payer: Self-pay

## 2021-07-10 ENCOUNTER — Encounter: Payer: Self-pay | Admitting: Gastroenterology

## 2021-07-10 NOTE — Telephone Encounter (Signed)
°  Follow up Call-  Call back number 07/08/2021 11/04/2018  Post procedure Call Back phone  # [phone]  Permission to leave phone message Yes Yes  Some recent data might be hidden     Patient questions:  Do you have a fever, pain , or abdominal swelling? No. Pain Score  0 *  Have you tolerated food without any problems? Yes.    Have you been able to return to your normal activities? Yes.    Do you have any questions about your discharge instructions: Diet   No. Medications  No. Follow up visit  No.  Do you have questions or concerns about your Care? No.  Actions: * If pain score is 4 or above: No action needed, pain <4.

## 2021-07-11 DIAGNOSIS — N816 Rectocele: Secondary | ICD-10-CM | POA: Diagnosis not present

## 2021-07-11 DIAGNOSIS — N765 Ulceration of vagina: Secondary | ICD-10-CM | POA: Diagnosis not present

## 2021-07-11 DIAGNOSIS — Z4689 Encounter for fitting and adjustment of other specified devices: Secondary | ICD-10-CM | POA: Diagnosis not present

## 2021-07-11 DIAGNOSIS — N8111 Cystocele, midline: Secondary | ICD-10-CM | POA: Diagnosis not present

## 2021-07-12 DIAGNOSIS — Z1231 Encounter for screening mammogram for malignant neoplasm of breast: Secondary | ICD-10-CM | POA: Diagnosis not present

## 2021-07-12 DIAGNOSIS — M85851 Other specified disorders of bone density and structure, right thigh: Secondary | ICD-10-CM | POA: Diagnosis not present

## 2021-07-26 ENCOUNTER — Ambulatory Visit: Payer: Medicare PPO | Admitting: Gastroenterology

## 2021-07-26 ENCOUNTER — Other Ambulatory Visit: Payer: Self-pay

## 2021-07-26 ENCOUNTER — Encounter: Payer: Self-pay | Admitting: Gastroenterology

## 2021-07-26 VITALS — BP 122/84 | HR 62 | Wt 189.1 lb

## 2021-07-26 DIAGNOSIS — Z86010 Personal history of colonic polyps: Secondary | ICD-10-CM

## 2021-07-26 DIAGNOSIS — K219 Gastro-esophageal reflux disease without esophagitis: Secondary | ICD-10-CM

## 2021-07-26 DIAGNOSIS — Q438 Other specified congenital malformations of intestine: Secondary | ICD-10-CM

## 2021-07-26 NOTE — Progress Notes (Signed)
Chief Complaint:    GERD, discuss antireflux surgical options  GI History: 76 year old female with asthma, GERD, pelvic prolapse, follows in the GI clinic for the following:   Longstanding history of GERD.  Well-controlled with omeprazole 40 mg/day.  No dysphagia.  EGD approximately 2005.  No records for review.   History of colon polyps with last colonoscopy in 11/2018 notable for 2 SSP's, measuring 8-11 mm.  History of significantly tortuous colon as outlined below.  Has pelvic pessary in place for prolapse that needs to be removed prior to colonoscopies.   Endoscopic History: - Flexible sigmoidoscopy (2001): Normal per patient - Colonoscopy (2011): Incomplete procedure due to tortuosity and redundancy - Air-contrast barium enema (2011): "Extremely redundant":, Sigmoid diverticulosis, some retained stool but no masses or large polyps - Colonoscopy (11/2018): 2 flat polyps in transverse colon measuring 8-11 mm (path: SSP's), 4 mm rectal polyp (path: HP), Diverticulosis.  Significantly tortuous colon.  Abdominal binder placed at start of procedure and advanced colonoscope using abdominal pressure and water irrigation.  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  Normal TI.  Recommended repeat in 3 years. - Colonoscopy (07/2021): 12 mm descending colon polyp (path: SSP), 4 mm sigmoid polyp (path: SSP), Diverticulosis, significantly tortuous colon.  Narrow rectal vault.  Repeat in 3 years  HPI:     Patient is a 76 y.o. female presenting to the Gastroenterology Clinic for follow-up and to discuss antireflux surgical options.  Last seen by me on 06/21/2021.  Since then, completed repeat colonoscopy as outlined above.  As above, she has a longstanding history of reflux.  Sxs relatively well controlled with omeprazole 40 mg/day, but she is interested in antireflux surgical options as a means to better control her reflux and potentially stop or significantly reduce the need for acid  suppression therapy long-term.  She has tried weaning off PPI with breaktrhough after a few days.  No dysphagia.  GERD-HRQL Score: 12/50 (on daily PPI)  Review of systems:     No chest pain, no SOB, no fevers, no urinary sx   Past Medical History:  Diagnosis Date   Acid reflux    Allergy    Asthma    Environmental and seasonal allergies    History of basal cell carcinoma    skin cancer   History of squamous cell carcinoma    Osteopenia after menopause    Pelvic prolapse    Positive colorectal cancer screening using Cologuard test 08/06/2018   Referred 08/06/18 to GI    Patient's surgical history, family medical history, social history, medications and allergies were all reviewed in Epic    Current Outpatient Medications  Medication Sig Dispense Refill   albuterol (VENTOLIN HFA) 108 (90 Base) MCG/ACT inhaler Inhale 1-2 puffs into the lungs every 4 (four) hours as needed for wheezing or shortness of breath. 18 g 99   Calcium Carb-Cholecalciferol (CALCIUM PLUS D3 ABSORBABLE PO) Take by mouth.     diclofenac Sodium (VOLTAREN) 1 % GEL Apply 4 g topically 4 (four) times daily. To affected joint. 300 g 11   Fluticasone-Salmeterol (ADVAIR) 100-50 MCG/DOSE AEPB INHALE ONE PUFF BY MOUTH TWICE A DAY 60 each 11   levocetirizine (XYZAL) 5 MG tablet Take 1 tablet (5 mg total) by mouth daily. 90 tablet 3   Multiple Vitamins-Minerals (WOMENS MULTIVITAMIN PO) Take by mouth.     omeprazole (PRILOSEC) 40 MG capsule Take 1 capsule (40 mg total) by mouth daily. 90 capsule 3   XIIDRA 5 %  SOLN Place 1 drop into both eyes 2 (two) times daily.  10   No current facility-administered medications for this visit.    Physical Exam:     BP 122/84 (BP Location: Left Arm, Patient Position: Sitting, Cuff Size: Normal)    Pulse 62    Wt 189 lb 2 oz (85.8 kg)    SpO2 96%    BMI 30.53 kg/m   GENERAL:  Pleasant female in NAD PSYCH: : Cooperative, normal affect Musculoskeletal:  Normal muscle tone, normal  strength NEURO: Alert and oriented x 3, no focal neurologic deficits   IMPRESSION and PLAN:    1) GERD Longstanding history of GERD, relatively well controlled with omeprazole 40 mg/day which she has taken for years.  Previously used Nexium that was equally efficacious.  She is interested in antireflux surgical options as a means to stop or significantly reduce the need for acid suppression therapy.  We discussed those options at length today, to include TIF, Nissen fundoplication, partial fundoplication, and [place].  She would like to think about these options more.  Provided with TIF handouts along with video link.  If she decides she wants to move forward with surgical evaluation, plan will be as follows: - EGD to evaluate for erosive esophagitis, hiatal hernia, LES laxity.  If no erosive esophagitis, plan to place Bravo at that time (off PPI x7 days)  -In the meantime, continue omeprazole and antireflux lifestyle/dietary modifications  2) History of colon polyps 3) Tortuous colon - Repeat colonoscopy in 2026 for ongoing polyp surveillance  I spent 35 minutes of time, including in depth chart review, independent review of results as outlined above, communicating results with the patient directly, face-to-face time with the patient, coordinating care, and ordering studies and medications as appropriate, and documentation.          Lavena Bullion ,DO, FACG 07/26/2021, 1:55 PM

## 2021-07-26 NOTE — Patient Instructions (Addendum)
°  If you are age 77 or older, your body mass index should be between 23-30. Your Body mass index is 30.53 kg/m. If this is out of the aforementioned range listed, please consider follow up with your Primary Care Provider.  If you are age 64 or younger, your body mass index should be between 19-25. Your Body mass index is 30.53 kg/m. If this is out of the aformentioned range listed, please consider follow up with your Primary Care Provider.   __________________________________________________________  The [facility] providers would like to encourage you to use [facility] to communicate with providers for non-urgent requests or questions.  Due to long hold times on the telephone, sending your provider a message by [facility] may be a faster and more efficient way to get a response.  Please allow 48 business hours for a response.  Please remember that this is for non-urgent requests.     - If you would like to view an informative video regarding the TIF procedure, please visit:  [email or web address]   Thank you for choosing me and [facility].  Vito Cirigliano, D.O.

## 2021-07-29 DIAGNOSIS — H16223 Keratoconjunctivitis sicca, not specified as Sjogren's, bilateral: Secondary | ICD-10-CM | POA: Diagnosis not present

## 2021-07-29 DIAGNOSIS — H25813 Combined forms of age-related cataract, bilateral: Secondary | ICD-10-CM | POA: Diagnosis not present

## 2021-08-15 DIAGNOSIS — N765 Ulceration of vagina: Secondary | ICD-10-CM | POA: Diagnosis not present

## 2021-08-15 DIAGNOSIS — N816 Rectocele: Secondary | ICD-10-CM | POA: Diagnosis not present

## 2021-08-15 DIAGNOSIS — Z4689 Encounter for fitting and adjustment of other specified devices: Secondary | ICD-10-CM | POA: Diagnosis not present

## 2021-08-15 DIAGNOSIS — N814 Uterovaginal prolapse, unspecified: Secondary | ICD-10-CM | POA: Diagnosis not present

## 2021-08-23 DIAGNOSIS — Z85828 Personal history of other malignant neoplasm of skin: Secondary | ICD-10-CM | POA: Diagnosis not present

## 2021-08-23 DIAGNOSIS — L820 Inflamed seborrheic keratosis: Secondary | ICD-10-CM | POA: Diagnosis not present

## 2021-08-23 DIAGNOSIS — L821 Other seborrheic keratosis: Secondary | ICD-10-CM | POA: Diagnosis not present

## 2021-08-23 DIAGNOSIS — D1801 Hemangioma of skin and subcutaneous tissue: Secondary | ICD-10-CM | POA: Diagnosis not present

## 2021-08-23 DIAGNOSIS — L814 Other melanin hyperpigmentation: Secondary | ICD-10-CM | POA: Diagnosis not present

## 2021-08-23 DIAGNOSIS — L570 Actinic keratosis: Secondary | ICD-10-CM | POA: Diagnosis not present

## 2021-08-27 DIAGNOSIS — K219 Gastro-esophageal reflux disease without esophagitis: Secondary | ICD-10-CM | POA: Diagnosis not present

## 2021-08-27 DIAGNOSIS — M7061 Trochanteric bursitis, right hip: Secondary | ICD-10-CM | POA: Diagnosis not present

## 2021-08-27 DIAGNOSIS — J3089 Other allergic rhinitis: Secondary | ICD-10-CM | POA: Diagnosis not present

## 2021-09-16 DIAGNOSIS — J3089 Other allergic rhinitis: Secondary | ICD-10-CM | POA: Diagnosis not present

## 2021-09-16 DIAGNOSIS — J208 Acute bronchitis due to other specified organisms: Secondary | ICD-10-CM | POA: Diagnosis not present

## 2021-09-25 DIAGNOSIS — Z4689 Encounter for fitting and adjustment of other specified devices: Secondary | ICD-10-CM | POA: Diagnosis not present

## 2021-09-25 DIAGNOSIS — N816 Rectocele: Secondary | ICD-10-CM | POA: Diagnosis not present

## 2021-09-25 DIAGNOSIS — N814 Uterovaginal prolapse, unspecified: Secondary | ICD-10-CM | POA: Diagnosis not present

## 2021-09-25 DIAGNOSIS — N952 Postmenopausal atrophic vaginitis: Secondary | ICD-10-CM | POA: Diagnosis not present

## 2021-10-29 DIAGNOSIS — M85851 Other specified disorders of bone density and structure, right thigh: Secondary | ICD-10-CM | POA: Diagnosis not present

## 2021-10-29 DIAGNOSIS — Z7189 Other specified counseling: Secondary | ICD-10-CM | POA: Diagnosis not present

## 2021-10-29 DIAGNOSIS — Z87311 Personal history of (healed) other pathological fracture: Secondary | ICD-10-CM | POA: Diagnosis not present

## 2021-10-29 DIAGNOSIS — E28319 Asymptomatic premature menopause: Secondary | ICD-10-CM | POA: Diagnosis not present

## 2021-12-29 IMAGING — DX DG KNEE 1-2V*R*
2 series · 2 of 2 positions shown · non-contrast
Comparison: None

CLINICAL DATA: Left the pain, chronic.

EXAM:
RIGHT KNEE - 1-2 VIEW

[tunnel]
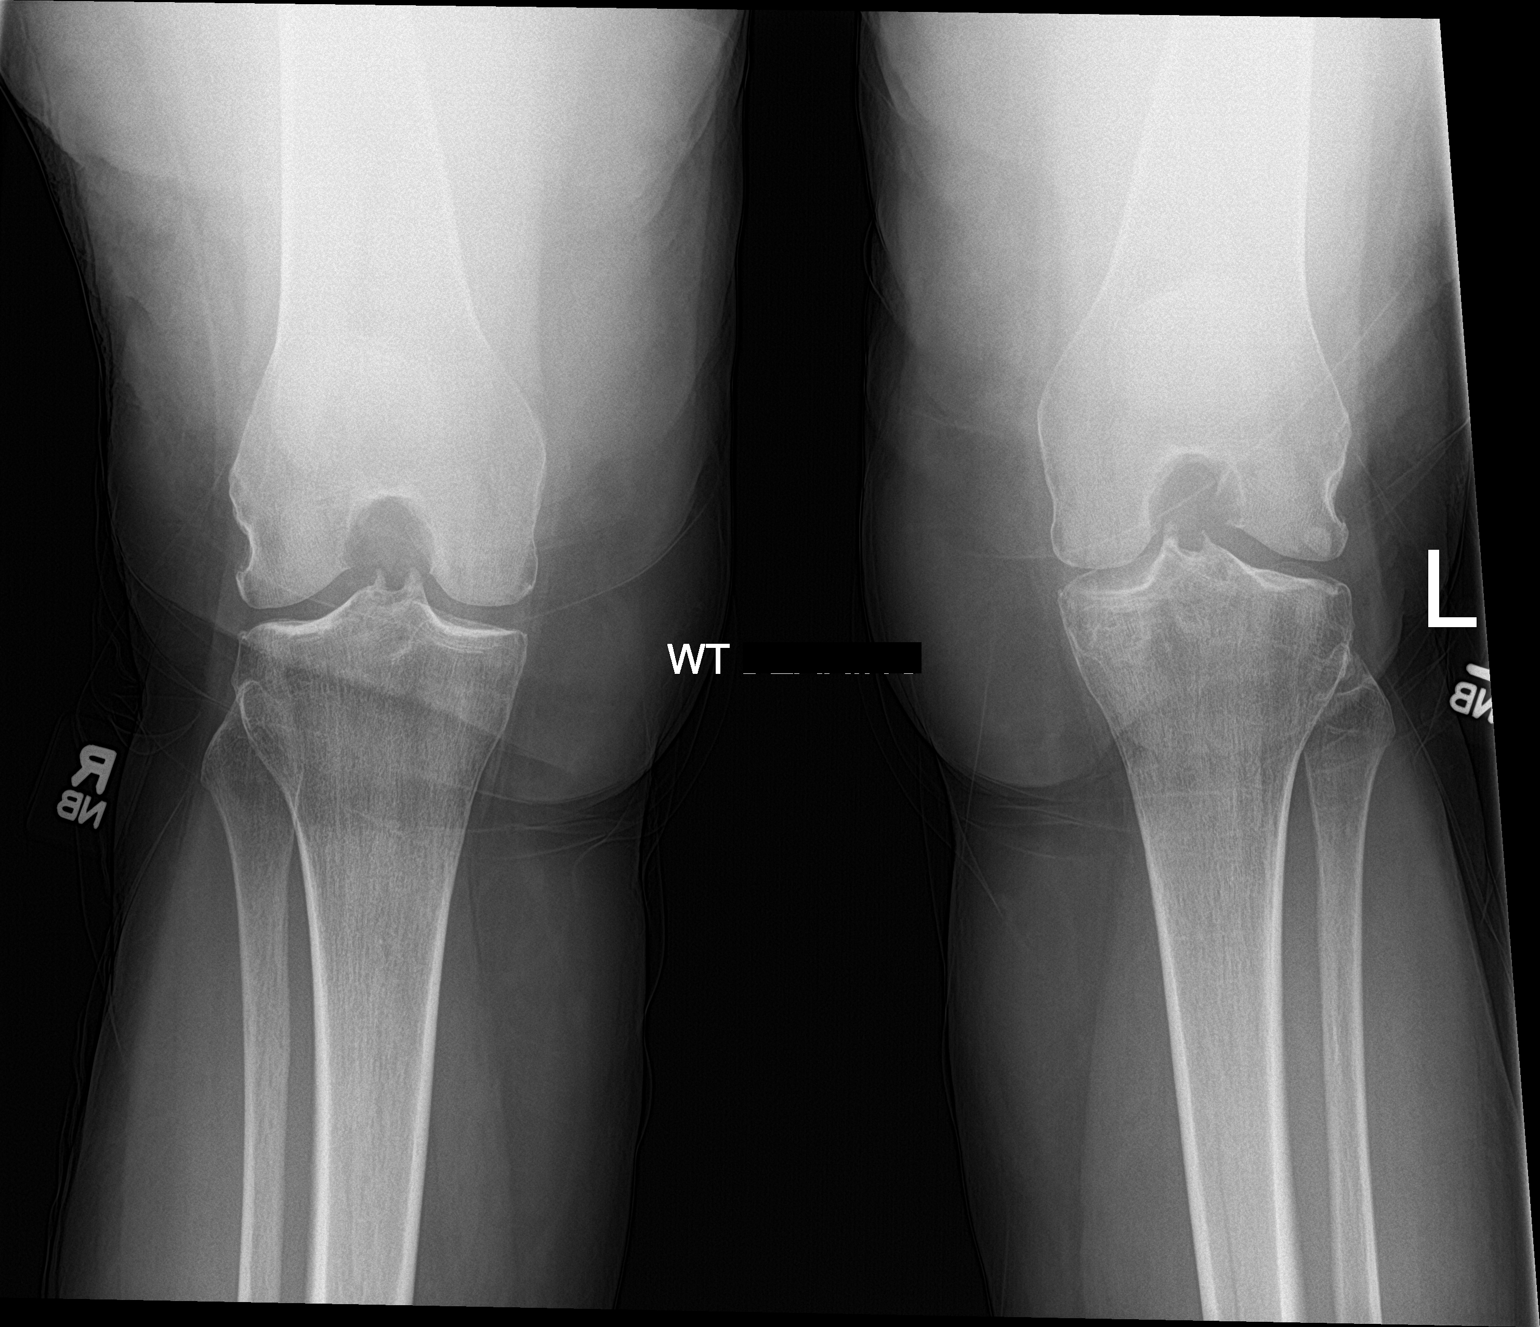

[knee ap]
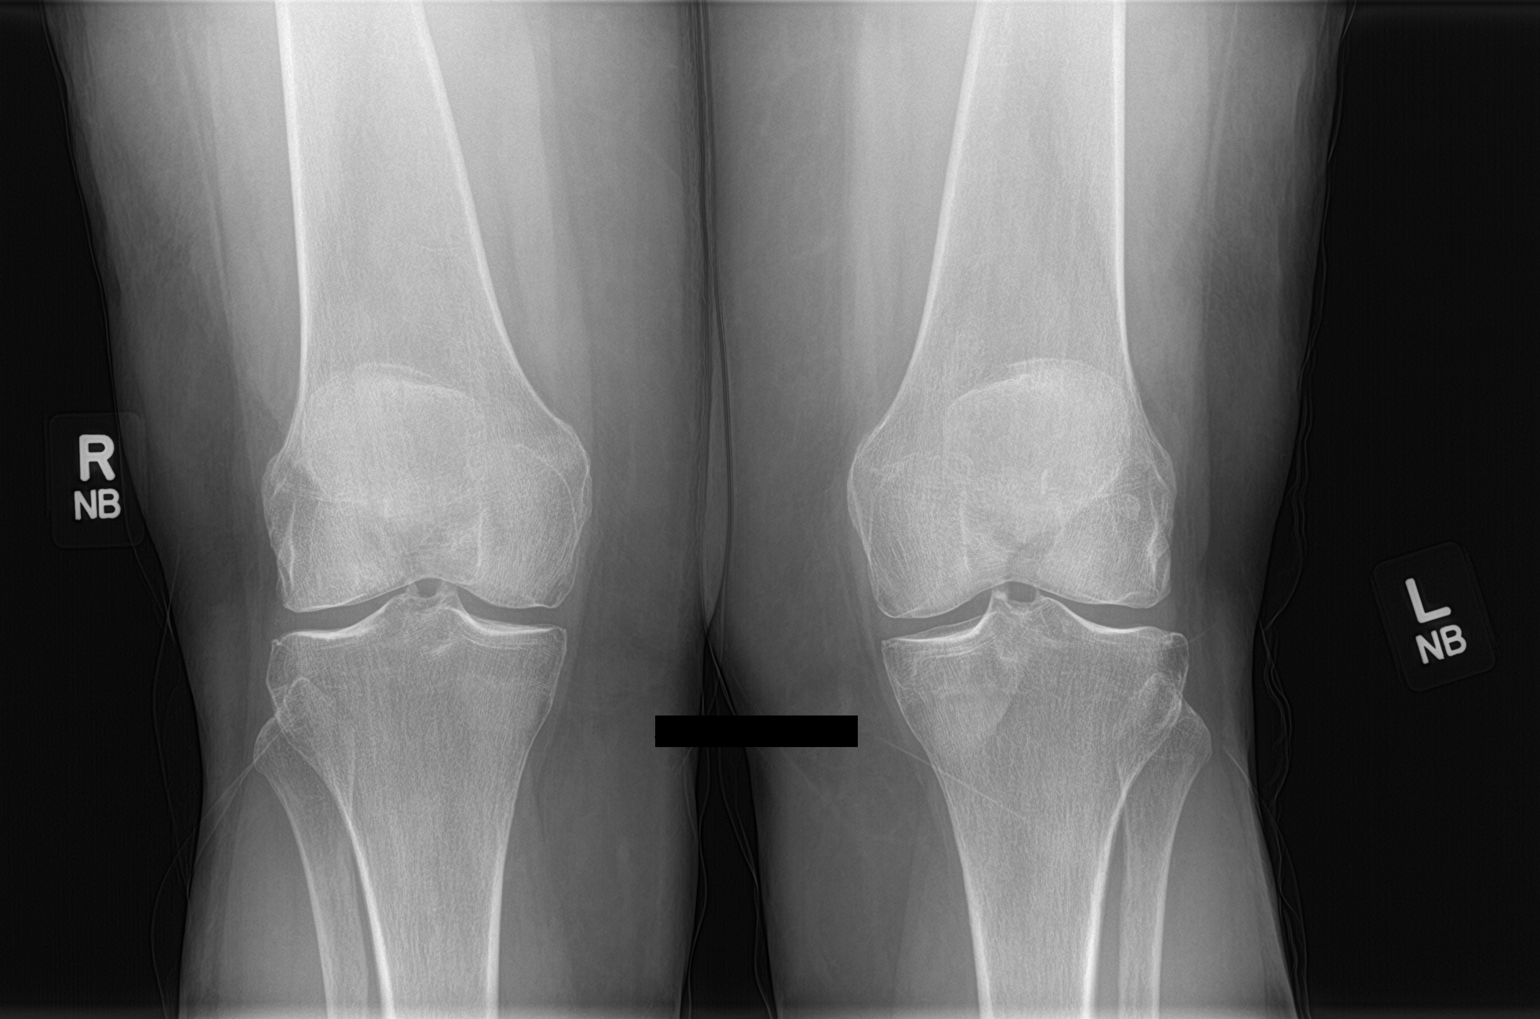

[2 of 2 positions shown; findings below may reference images not displayed]

FINDINGS: No fracture or dislocation identified. Sharpening of the tibial
spines. Mild lateral compartment marginal spur formation noted. Mild
medial and lateral compartment joint space narrowing.
IMPRESSION: 1. Mild degenerative joint disease.
2. No acute abnormality.
